# Patient Record
Sex: Male | Born: 1960 | Race: Black or African American | Hispanic: No | Marital: Married | Smoking: Current every day smoker
Health system: Southern US, Community
[De-identification: ages and names within clinical notes are randomized; demographics above are authoritative.]

## PROBLEM LIST (undated history)

## (undated) DIAGNOSIS — F329 Major depressive disorder, single episode, unspecified: Secondary | ICD-10-CM

## (undated) DIAGNOSIS — F250 Schizoaffective disorder, bipolar type: Secondary | ICD-10-CM

## (undated) DIAGNOSIS — Z21 Asymptomatic human immunodeficiency virus [HIV] infection status: Secondary | ICD-10-CM

## (undated) DIAGNOSIS — B20 Human immunodeficiency virus [HIV] disease: Secondary | ICD-10-CM

## (undated) DIAGNOSIS — F17210 Nicotine dependence, cigarettes, uncomplicated: Secondary | ICD-10-CM

## (undated) DIAGNOSIS — R21 Rash and other nonspecific skin eruption: Secondary | ICD-10-CM

## (undated) DIAGNOSIS — F32A Depression, unspecified: Secondary | ICD-10-CM

## (undated) DIAGNOSIS — F319 Bipolar disorder, unspecified: Secondary | ICD-10-CM

## (undated) HISTORY — DX: Schizoaffective disorder, bipolar type: F25.0

## (undated) HISTORY — DX: Depression, unspecified: F32.A

## (undated) HISTORY — DX: Rash and other nonspecific skin eruption: R21

## (undated) HISTORY — DX: Nicotine dependence, cigarettes, uncomplicated: F17.210

## (undated) HISTORY — DX: Asymptomatic human immunodeficiency virus (hiv) infection status: Z21

## (undated) HISTORY — DX: Bipolar disorder, unspecified: F31.9

## (undated) HISTORY — DX: Human immunodeficiency virus (HIV) disease: B20

---
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
## 1989-10-10 HISTORY — PX: OTHER SURGICAL HISTORY: SHX169

## 1999-06-25 ENCOUNTER — Encounter: Admission: RE | Admit: 1999-06-25 | Discharge: 1999-06-25 | Payer: Self-pay | Admitting: Internal Medicine

## 1999-06-25 ENCOUNTER — Ambulatory Visit (HOSPITAL_COMMUNITY): Admission: RE | Admit: 1999-06-25 | Discharge: 1999-06-25 | Payer: Self-pay | Admitting: Internal Medicine

## 1999-07-27 ENCOUNTER — Encounter: Admission: RE | Admit: 1999-07-27 | Discharge: 1999-07-27 | Payer: Self-pay | Admitting: Internal Medicine

## 1999-08-26 ENCOUNTER — Encounter: Admission: RE | Admit: 1999-08-26 | Discharge: 1999-08-26 | Payer: Self-pay | Admitting: Internal Medicine

## 1999-08-26 ENCOUNTER — Ambulatory Visit (HOSPITAL_COMMUNITY): Admission: RE | Admit: 1999-08-26 | Discharge: 1999-08-26 | Payer: Self-pay | Admitting: Internal Medicine

## 1999-09-13 ENCOUNTER — Encounter: Admission: RE | Admit: 1999-09-13 | Discharge: 1999-09-13 | Payer: Self-pay | Admitting: Internal Medicine

## 1999-12-28 ENCOUNTER — Ambulatory Visit (HOSPITAL_COMMUNITY): Admission: RE | Admit: 1999-12-28 | Discharge: 1999-12-28 | Payer: Self-pay | Admitting: Internal Medicine

## 1999-12-28 ENCOUNTER — Encounter: Admission: RE | Admit: 1999-12-28 | Discharge: 1999-12-28 | Payer: Self-pay | Admitting: Internal Medicine

## 2000-06-13 ENCOUNTER — Encounter: Admission: RE | Admit: 2000-06-13 | Discharge: 2000-06-13 | Payer: Self-pay | Admitting: Hematology and Oncology

## 2000-06-13 ENCOUNTER — Ambulatory Visit (HOSPITAL_COMMUNITY): Admission: RE | Admit: 2000-06-13 | Discharge: 2000-06-13 | Payer: Self-pay | Admitting: Internal Medicine

## 2000-06-27 ENCOUNTER — Encounter: Admission: RE | Admit: 2000-06-27 | Discharge: 2000-06-27 | Payer: Self-pay | Admitting: Internal Medicine

## 2000-08-29 ENCOUNTER — Encounter: Admission: RE | Admit: 2000-08-29 | Discharge: 2000-08-29 | Payer: Self-pay | Admitting: Internal Medicine

## 2000-10-31 ENCOUNTER — Encounter: Admission: RE | Admit: 2000-10-31 | Discharge: 2000-10-31 | Payer: Self-pay | Admitting: Internal Medicine

## 2001-01-03 ENCOUNTER — Encounter: Admission: RE | Admit: 2001-01-03 | Discharge: 2001-01-03 | Payer: Self-pay | Admitting: Internal Medicine

## 2001-01-03 ENCOUNTER — Ambulatory Visit (HOSPITAL_COMMUNITY): Admission: RE | Admit: 2001-01-03 | Discharge: 2001-01-03 | Payer: Self-pay | Admitting: Internal Medicine

## 2001-01-30 ENCOUNTER — Encounter: Admission: RE | Admit: 2001-01-30 | Discharge: 2001-01-30 | Payer: Self-pay | Admitting: Internal Medicine

## 2001-05-23 ENCOUNTER — Inpatient Hospital Stay (HOSPITAL_COMMUNITY): Admission: EM | Admit: 2001-05-23 | Discharge: 2001-05-28 | Payer: Self-pay | Admitting: Psychiatry

## 2001-08-28 ENCOUNTER — Encounter: Admission: RE | Admit: 2001-08-28 | Discharge: 2001-08-28 | Payer: Self-pay | Admitting: Internal Medicine

## 2001-08-28 ENCOUNTER — Ambulatory Visit (HOSPITAL_COMMUNITY): Admission: RE | Admit: 2001-08-28 | Discharge: 2001-08-28 | Payer: Self-pay | Admitting: Internal Medicine

## 2001-09-18 ENCOUNTER — Encounter: Admission: RE | Admit: 2001-09-18 | Discharge: 2001-09-18 | Payer: Self-pay | Admitting: Internal Medicine

## 2002-05-07 ENCOUNTER — Ambulatory Visit (HOSPITAL_COMMUNITY): Admission: RE | Admit: 2002-05-07 | Discharge: 2002-05-07 | Payer: Self-pay | Admitting: Internal Medicine

## 2002-05-07 ENCOUNTER — Encounter: Admission: RE | Admit: 2002-05-07 | Discharge: 2002-05-07 | Payer: Self-pay | Admitting: Internal Medicine

## 2002-06-11 ENCOUNTER — Encounter: Admission: RE | Admit: 2002-06-11 | Discharge: 2002-06-11 | Payer: Self-pay | Admitting: Internal Medicine

## 2002-08-22 ENCOUNTER — Encounter: Admission: RE | Admit: 2002-08-22 | Discharge: 2002-08-22 | Payer: Self-pay | Admitting: Internal Medicine

## 2002-08-22 ENCOUNTER — Ambulatory Visit (HOSPITAL_COMMUNITY): Admission: RE | Admit: 2002-08-22 | Discharge: 2002-08-22 | Payer: Self-pay | Admitting: Internal Medicine

## 2002-09-24 ENCOUNTER — Encounter: Admission: RE | Admit: 2002-09-24 | Discharge: 2002-09-24 | Payer: Self-pay | Admitting: Internal Medicine

## 2002-12-12 ENCOUNTER — Inpatient Hospital Stay (HOSPITAL_COMMUNITY): Admission: EM | Admit: 2002-12-12 | Discharge: 2002-12-15 | Payer: Self-pay | Admitting: Psychiatry

## 2003-01-27 ENCOUNTER — Encounter: Admission: RE | Admit: 2003-01-27 | Discharge: 2003-01-27 | Payer: Self-pay | Admitting: Internal Medicine

## 2003-02-04 ENCOUNTER — Encounter: Admission: RE | Admit: 2003-02-04 | Discharge: 2003-02-04 | Payer: Self-pay | Admitting: Internal Medicine

## 2003-04-01 ENCOUNTER — Encounter: Admission: RE | Admit: 2003-04-01 | Discharge: 2003-04-01 | Payer: Self-pay | Admitting: Internal Medicine

## 2003-08-26 ENCOUNTER — Encounter: Admission: RE | Admit: 2003-08-26 | Discharge: 2003-08-26 | Payer: Self-pay | Admitting: Internal Medicine

## 2003-08-26 ENCOUNTER — Ambulatory Visit (HOSPITAL_COMMUNITY): Admission: RE | Admit: 2003-08-26 | Discharge: 2003-08-26 | Payer: Self-pay | Admitting: Internal Medicine

## 2003-09-09 ENCOUNTER — Encounter: Admission: RE | Admit: 2003-09-09 | Discharge: 2003-09-09 | Payer: Self-pay | Admitting: Internal Medicine

## 2004-02-06 ENCOUNTER — Inpatient Hospital Stay (HOSPITAL_COMMUNITY): Admission: RE | Admit: 2004-02-06 | Discharge: 2004-02-12 | Payer: Self-pay | Admitting: *Deleted

## 2004-02-06 ENCOUNTER — Emergency Department (HOSPITAL_COMMUNITY): Admission: EM | Admit: 2004-02-06 | Discharge: 2004-02-06 | Payer: Self-pay

## 2004-02-23 ENCOUNTER — Encounter: Admission: RE | Admit: 2004-02-23 | Discharge: 2004-02-23 | Payer: Self-pay | Admitting: Internal Medicine

## 2004-03-09 ENCOUNTER — Encounter: Admission: RE | Admit: 2004-03-09 | Discharge: 2004-03-09 | Payer: Self-pay | Admitting: Internal Medicine

## 2004-05-09 ENCOUNTER — Emergency Department (HOSPITAL_COMMUNITY): Admission: EM | Admit: 2004-05-09 | Discharge: 2004-05-09 | Payer: Self-pay | Admitting: Emergency Medicine

## 2004-05-20 ENCOUNTER — Emergency Department (HOSPITAL_COMMUNITY): Admission: EM | Admit: 2004-05-20 | Discharge: 2004-05-21 | Payer: Self-pay | Admitting: Emergency Medicine

## 2004-05-24 ENCOUNTER — Encounter: Admission: RE | Admit: 2004-05-24 | Discharge: 2004-05-24 | Payer: Self-pay | Admitting: Internal Medicine

## 2004-05-24 ENCOUNTER — Ambulatory Visit (HOSPITAL_COMMUNITY): Admission: RE | Admit: 2004-05-24 | Discharge: 2004-05-24 | Payer: Self-pay | Admitting: Internal Medicine

## 2004-07-27 ENCOUNTER — Ambulatory Visit: Payer: Self-pay | Admitting: Internal Medicine

## 2004-09-15 ENCOUNTER — Ambulatory Visit: Payer: Self-pay | Admitting: Internal Medicine

## 2004-10-06 ENCOUNTER — Ambulatory Visit: Payer: Self-pay | Admitting: Internal Medicine

## 2004-10-08 ENCOUNTER — Ambulatory Visit: Payer: Self-pay | Admitting: Internal Medicine

## 2004-11-08 ENCOUNTER — Ambulatory Visit: Payer: Self-pay | Admitting: Internal Medicine

## 2004-11-11 ENCOUNTER — Ambulatory Visit: Payer: Self-pay | Admitting: Internal Medicine

## 2004-11-17 ENCOUNTER — Encounter: Admission: RE | Admit: 2004-11-17 | Discharge: 2005-02-15 | Payer: Self-pay | Admitting: Internal Medicine

## 2004-12-09 ENCOUNTER — Ambulatory Visit: Payer: Self-pay | Admitting: Internal Medicine

## 2004-12-09 ENCOUNTER — Ambulatory Visit (HOSPITAL_COMMUNITY): Admission: RE | Admit: 2004-12-09 | Discharge: 2004-12-09 | Payer: Self-pay | Admitting: Internal Medicine

## 2004-12-21 ENCOUNTER — Ambulatory Visit: Payer: Self-pay | Admitting: Internal Medicine

## 2005-02-04 ENCOUNTER — Ambulatory Visit: Payer: Self-pay | Admitting: Internal Medicine

## 2005-02-23 ENCOUNTER — Ambulatory Visit: Payer: Self-pay | Admitting: Endocrinology

## 2005-03-14 ENCOUNTER — Inpatient Hospital Stay (HOSPITAL_COMMUNITY): Admission: RE | Admit: 2005-03-14 | Discharge: 2005-03-17 | Payer: Self-pay | Admitting: Psychiatry

## 2005-03-14 ENCOUNTER — Ambulatory Visit: Payer: Self-pay | Admitting: Psychiatry

## 2005-03-19 ENCOUNTER — Emergency Department (HOSPITAL_COMMUNITY): Admission: EM | Admit: 2005-03-19 | Discharge: 2005-03-19 | Payer: Self-pay | Admitting: Emergency Medicine

## 2005-04-14 ENCOUNTER — Ambulatory Visit: Payer: Self-pay | Admitting: Psychiatry

## 2005-04-14 ENCOUNTER — Inpatient Hospital Stay (HOSPITAL_COMMUNITY): Admission: RE | Admit: 2005-04-14 | Discharge: 2005-04-15 | Payer: Self-pay | Admitting: Psychiatry

## 2005-07-15 ENCOUNTER — Emergency Department (HOSPITAL_COMMUNITY): Admission: EM | Admit: 2005-07-15 | Discharge: 2005-07-15 | Payer: Self-pay

## 2005-08-02 ENCOUNTER — Ambulatory Visit: Payer: Self-pay | Admitting: Psychiatry

## 2005-08-12 ENCOUNTER — Ambulatory Visit (HOSPITAL_COMMUNITY): Admission: RE | Admit: 2005-08-12 | Discharge: 2005-08-12 | Payer: Self-pay | Admitting: Family Medicine

## 2005-08-30 ENCOUNTER — Ambulatory Visit: Payer: Self-pay | Admitting: Psychiatry

## 2005-09-10 ENCOUNTER — Emergency Department (HOSPITAL_COMMUNITY): Admission: EM | Admit: 2005-09-10 | Discharge: 2005-09-11 | Payer: Self-pay | Admitting: Emergency Medicine

## 2011-05-12 ENCOUNTER — Ambulatory Visit (INDEPENDENT_AMBULATORY_CARE_PROVIDER_SITE_OTHER): Payer: Federal, State, Local not specified - PPO

## 2011-05-12 DIAGNOSIS — Z113 Encounter for screening for infections with a predominantly sexual mode of transmission: Secondary | ICD-10-CM

## 2011-05-12 DIAGNOSIS — F319 Bipolar disorder, unspecified: Secondary | ICD-10-CM

## 2011-05-12 DIAGNOSIS — B20 Human immunodeficiency virus [HIV] disease: Secondary | ICD-10-CM

## 2011-05-12 DIAGNOSIS — Z119 Encounter for screening for infectious and parasitic diseases, unspecified: Secondary | ICD-10-CM

## 2011-05-12 DIAGNOSIS — F259 Schizoaffective disorder, unspecified: Secondary | ICD-10-CM

## 2011-05-12 DIAGNOSIS — Z1159 Encounter for screening for other viral diseases: Secondary | ICD-10-CM

## 2011-05-12 DIAGNOSIS — Z111 Encounter for screening for respiratory tuberculosis: Secondary | ICD-10-CM

## 2011-05-12 DIAGNOSIS — R21 Rash and other nonspecific skin eruption: Secondary | ICD-10-CM

## 2011-05-12 DIAGNOSIS — Z13220 Encounter for screening for lipoid disorders: Secondary | ICD-10-CM

## 2011-05-12 LAB — URINALYSIS
Bilirubin Urine: NEGATIVE
Hgb urine dipstick: NEGATIVE
Ketones, ur: NEGATIVE mg/dL
Protein, ur: NEGATIVE mg/dL
Specific Gravity, Urine: 1.017 (ref 1.005–1.030)
Urobilinogen, UA: 0.2 mg/dL (ref 0.0–1.0)
pH: 5 (ref 5.0–8.0)

## 2011-05-12 LAB — LIPID PANEL
Cholesterol: 181 mg/dL (ref 0–200)
HDL: 39 mg/dL — ABNORMAL LOW (ref >39)
Total CHOL/HDL Ratio: 4.6 Ratio
Triglycerides: 61 mg/dL (ref <150)
VLDL: 12 mg/dL (ref 0–40)

## 2011-05-12 LAB — RPR

## 2011-05-12 LAB — HEPATITIS C ANTIBODY: HCV Ab: NEGATIVE

## 2011-05-12 LAB — HEPATITIS B SURFACE ANTIGEN: Hepatitis B Surface Ag: NEGATIVE

## 2011-05-12 LAB — HEPATITIS B SURFACE ANTIBODY,QUALITATIVE: Hep B S Ab: NEGATIVE

## 2011-05-13 DIAGNOSIS — Z21 Asymptomatic human immunodeficiency virus [HIV] infection status: Secondary | ICD-10-CM

## 2011-05-13 DIAGNOSIS — B20 Human immunodeficiency virus [HIV] disease: Secondary | ICD-10-CM

## 2011-05-13 DIAGNOSIS — F319 Bipolar disorder, unspecified: Secondary | ICD-10-CM

## 2011-05-13 DIAGNOSIS — F259 Schizoaffective disorder, unspecified: Secondary | ICD-10-CM

## 2011-05-13 DIAGNOSIS — R21 Rash and other nonspecific skin eruption: Secondary | ICD-10-CM

## 2011-05-13 HISTORY — DX: Asymptomatic human immunodeficiency virus (hiv) infection status: Z21

## 2011-05-13 HISTORY — DX: Human immunodeficiency virus (HIV) disease: B20

## 2011-05-13 HISTORY — DX: Bipolar disorder, unspecified: F31.9

## 2011-05-13 HISTORY — DX: Rash and other nonspecific skin eruption: R21

## 2011-05-13 HISTORY — DX: Schizoaffective disorder, unspecified: F25.9

## 2011-05-13 LAB — COMPLETE METABOLIC PANEL WITH GFR
ALT: 13 U/L (ref 0–53)
AST: 21 U/L (ref 0–37)
CO2: 26 mEq/L (ref 19–32)
Chloride: 102 mEq/L (ref 96–112)
Creat: 0.97 mg/dL (ref 0.50–1.35)
GFR, Est African American: >60 mL/min (ref >60)
GFR, Est Non African American: >60 mL/min (ref >60)
Potassium: 4.3 mEq/L (ref 3.5–5.3)
Sodium: 135 mEq/L (ref 135–145)
Total Bilirubin: 0.6 mg/dL (ref 0.3–1.2)
Total Protein: 7.9 g/dL (ref 6.0–8.3)

## 2011-05-13 LAB — CBC WITH DIFFERENTIAL/PLATELET
Basophils Absolute: 0 10*3/uL (ref 0.0–0.1)
Basophils Relative: 1 % (ref 0–1)
Eosinophils Absolute: 0.2 10*3/uL (ref 0.0–0.7)
Eosinophils Relative: 3 % (ref 0–5)
HCT: 44.9 % (ref 39.0–52.0)
Hemoglobin: 15.7 g/dL (ref 13.0–17.0)
Lymphocytes Relative: 45 % (ref 12–46)
MCH: 30.7 pg (ref 26.0–34.0)
MCHC: 35 g/dL (ref 30.0–36.0)
MCV: 87.9 fL (ref 78.0–100.0)
Monocytes Absolute: 0.4 10*3/uL (ref 0.1–1.0)
Monocytes Relative: 5 % (ref 3–12)
Neutro Abs: 3.4 10*3/uL (ref 1.7–7.7)
Neutrophils Relative %: 47 % (ref 43–77)
Platelets: 211 10*3/uL (ref 150–400)
RBC: 5.11 MIL/uL (ref 4.22–5.81)
RDW: 15.7 % — ABNORMAL HIGH (ref 11.5–15.5)
WBC: 7.2 10*3/uL (ref 4.0–10.5)

## 2011-05-13 LAB — HEPATITIS A ANTIBODY, IGM: Hep A IgM: NEGATIVE

## 2011-05-13 LAB — T-HELPER CELL (CD4) - (RCID CLINIC ONLY)
CD4 % Helper T Cell: 7 % — ABNORMAL LOW (ref 33–55)
CD4 T Cell Abs: 230 uL — ABNORMAL LOW (ref 400–2700)

## 2011-05-13 LAB — GC/CHLAMYDIA PROBE AMP, URINE
Chlamydia, Swab/Urine, PCR: NEGATIVE
GC Probe Amp, Urine: NEGATIVE

## 2011-05-13 LAB — HEPATITIS B CORE ANTIBODY, IGM: Hep B C IgM: NEGATIVE

## 2011-05-13 NOTE — Progress Notes (Signed)
Pt states he has been traveling around the [place] "just enjoying life" and has been taking his HAART" on and off". He has grown tired of moving from place to place and is" back in [place] to settle down". His last treatment was at the [facility] in [place] but there were no current HIV labs in records. He was very pleasant and pleased to be seeing Dr Orvan Falconer again who was his previous physician in 2006.

## 2011-05-16 LAB — HIV-1 RNA ULTRAQUANT REFLEX TO GENTYP+
HIV 1 RNA Quant: 48 copies/mL — ABNORMAL HIGH (ref <20)
HIV-1 RNA Quant, Log: 1.68 {Log} — ABNORMAL HIGH (ref <1.30)

## 2011-05-26 ENCOUNTER — Ambulatory Visit (INDEPENDENT_AMBULATORY_CARE_PROVIDER_SITE_OTHER): Payer: Federal, State, Local not specified - PPO | Admitting: Internal Medicine

## 2011-05-26 ENCOUNTER — Other Ambulatory Visit: Payer: Self-pay | Admitting: *Deleted

## 2011-05-26 ENCOUNTER — Encounter: Payer: Self-pay | Admitting: Internal Medicine

## 2011-05-26 VITALS — BP 127/88 | HR 72 | Temp 98.2°F | Ht 72.0 in | Wt 224.4 lb

## 2011-05-26 DIAGNOSIS — F17200 Nicotine dependence, unspecified, uncomplicated: Secondary | ICD-10-CM

## 2011-05-26 DIAGNOSIS — F17210 Nicotine dependence, cigarettes, uncomplicated: Secondary | ICD-10-CM | POA: Insufficient documentation

## 2011-05-26 DIAGNOSIS — B20 Human immunodeficiency virus [HIV] disease: Secondary | ICD-10-CM

## 2011-05-26 DIAGNOSIS — F259 Schizoaffective disorder, unspecified: Secondary | ICD-10-CM

## 2011-05-26 DIAGNOSIS — Z23 Encounter for immunization: Secondary | ICD-10-CM

## 2011-05-26 DIAGNOSIS — R21 Rash and other nonspecific skin eruption: Secondary | ICD-10-CM

## 2011-05-26 HISTORY — DX: Nicotine dependence, cigarettes, uncomplicated: F17.210

## 2011-05-26 MED ORDER — TRIAMCINOLONE ACETONIDE 0.1 % EX CREA: TOPICAL_CREAM | Freq: Two times a day (BID) | CUTANEOUS | Status: DC

## 2011-05-26 MED ORDER — RALTEGRAVIR POTASSIUM 400 MG PO TABS: 400.0000 mg | ORAL_TABLET | Freq: Two times a day (BID) | ORAL | Status: DC

## 2011-05-26 MED ORDER — SULFAMETHOXAZOLE-TMP DS 800-160 MG PO TABS: 1.0000 | ORAL_TABLET | Freq: Every day | ORAL | Status: DC

## 2011-05-26 MED ORDER — OLANZAPINE 20 MG PO TABS: 20.0000 mg | ORAL_TABLET | Freq: Every day | ORAL | Status: DC

## 2011-05-26 MED ORDER — EMTRICITABINE-TENOFOVIR DF 200-300 MG PO TABS: 1.0000 | ORAL_TABLET | Freq: Every day | ORAL | Status: DC

## 2011-05-26 NOTE — Progress Notes (Signed)
Addended by: Jennet Maduro D on: 05/26/2011 02:38 PM   Modules accepted: Orders

## 2011-05-26 NOTE — Assessment & Plan Note (Signed)
I will continue topical steroids as needed.

## 2011-05-26 NOTE — Telephone Encounter (Signed)
RN requested that the pt obtain Mental Health services for future refills of his mental health medications.  Pt to call [facility] for intake to obtain case management services.  Pt verbalized understanding.

## 2011-05-26 NOTE — Assessment & Plan Note (Signed)
I will continue Zyprexa.

## 2011-05-26 NOTE — Assessment & Plan Note (Signed)
He seems to be doing reasonably well on his current regimen with good adherence. His CD4 count is 230 and his viral load is 48. I will continue his current regimen. He has refused hepatitis B vaccination today stating that since she is not sexually active he should not be at risk for hepatitis B.

## 2011-05-26 NOTE — Progress Notes (Signed)
  Subjective:    Patient ID: Randall Garrett, male    DOB: 04/07/1961, 50 y.o.   MRN: [id number]  HPI [name] is in for his first visit here since 2006. He tells me that he moved to [place] in 2006 and quit taking his medications for a period of time. He says that his CD4 count down to 19 and it was very scary period for him. He developed a generalized pruritic rash. Biopsy showed an eosinophilic dermatitis that has been treated with topical steroids with some success. In 2009 he was restarted on a new regimen of Truvada and Isentress. He is on disability because of his bipolar disorder and HIV infection and has [employer]. He has been able to afford his co-pays and has tolerated the medication well. He states that he is only missed about 2 doses in the past month. He does not use her pillbox. He states that taking his medications correctly is simply programmed into his brain. He is currently living alone in an apartment hearing [place] [address]. He has not been sexually active in the past year and has no plans to become sexually active. He says that he is in somewhat of an emotional relationship but will not elaborate. He says that he is doing well and the Zyprexa has kept his bipolar disorder and check but does admit that he is under some emotional stress right now that he does not want to go into. He has thought about quitting cigarettes but does not feel that this is the time for him to try. He is not drinking any alcohol or using any illicit drugs.    Review of Systems     Objective:   Physical Exam  Constitutional: He appears well-developed and well-nourished. No distress.  HENT:  Mouth/Throat: Oropharynx is clear and moist. No oropharyngeal exudate.       He has about an 8 mm nontender nodule at the gumline on the lower left side. He states it's been there for about one year and is unchanged.  Eyes: Conjunctivae are normal.  Cardiovascular: Normal rate, regular  rhythm and normal heart sounds.   No murmur heard. Pulmonary/Chest: Effort normal and breath sounds normal. He has no rales.  Abdominal: Soft. Bowel sounds are normal. He exhibits no distension and no mass. There is no tenderness.  Skin:       He has some hyperpigmented, excoriated papules on his forearms and over the bridge of his nose  Psychiatric: He has a normal mood and affect.          Assessment & Plan:

## 2011-06-01 ENCOUNTER — Telehealth: Payer: Self-pay | Admitting: *Deleted

## 2011-06-01 NOTE — Telephone Encounter (Signed)
Pharmacy called advised patient usually takes his Zyprexa 2 tabs at bedtime and we sent 1 tab at bedtime. Looked in the patients chart note from his last visit and the provider wrote that he was to continue the Zyprexa without mention of any changes, because patient reports it is working to keep his bipolar under control. Based on this I gave the verbal to give 2 tabs at bedtime #60.

## 2011-07-15 ENCOUNTER — Other Ambulatory Visit: Payer: Self-pay | Admitting: Licensed Clinical Social Worker

## 2011-07-15 DIAGNOSIS — F319 Bipolar disorder, unspecified: Secondary | ICD-10-CM

## 2011-07-15 DIAGNOSIS — R21 Rash and other nonspecific skin eruption: Secondary | ICD-10-CM

## 2011-07-15 DIAGNOSIS — B20 Human immunodeficiency virus [HIV] disease: Secondary | ICD-10-CM

## 2011-07-15 MED ORDER — OLANZAPINE 20 MG PO TABS: 40.0000 mg | ORAL_TABLET | Freq: Every day | ORAL | Status: DC

## 2011-07-15 MED ORDER — SULFAMETHOXAZOLE-TMP DS 800-160 MG PO TABS: 1.0000 | ORAL_TABLET | Freq: Every day | ORAL | Status: DC

## 2011-07-15 MED ORDER — RALTEGRAVIR POTASSIUM 400 MG PO TABS: 400.0000 mg | ORAL_TABLET | Freq: Two times a day (BID) | ORAL | Status: DC

## 2011-07-15 MED ORDER — EMTRICITABINE-TENOFOVIR DF 200-300 MG PO TABS: 1.0000 | ORAL_TABLET | Freq: Every day | ORAL | Status: DC

## 2011-08-06 ENCOUNTER — Other Ambulatory Visit: Payer: Self-pay | Admitting: Internal Medicine

## 2011-08-06 DIAGNOSIS — F259 Schizoaffective disorder, unspecified: Secondary | ICD-10-CM

## 2011-08-15 ENCOUNTER — Other Ambulatory Visit: Payer: Federal, State, Local not specified - PPO

## 2011-08-15 ENCOUNTER — Other Ambulatory Visit (INDEPENDENT_AMBULATORY_CARE_PROVIDER_SITE_OTHER): Payer: Federal, State, Local not specified - PPO

## 2011-08-15 ENCOUNTER — Other Ambulatory Visit: Payer: Self-pay | Admitting: Internal Medicine

## 2011-08-15 DIAGNOSIS — B20 Human immunodeficiency virus [HIV] disease: Secondary | ICD-10-CM

## 2011-08-16 LAB — T-HELPER CELL (CD4) - (RCID CLINIC ONLY): CD4 % Helper T Cell: 8 % — ABNORMAL LOW (ref 33–55)

## 2011-08-17 LAB — HIV-1 RNA QUANT-NO REFLEX-BLD
HIV 1 RNA Quant: <20 copies/mL (ref <20)
HIV-1 RNA Quant, Log: <1.3 {Log} (ref <1.30)

## 2011-08-29 ENCOUNTER — Ambulatory Visit (INDEPENDENT_AMBULATORY_CARE_PROVIDER_SITE_OTHER): Payer: Federal, State, Local not specified - PPO | Admitting: Internal Medicine

## 2011-08-29 ENCOUNTER — Encounter: Payer: Self-pay | Admitting: Internal Medicine

## 2011-08-29 ENCOUNTER — Other Ambulatory Visit: Payer: Self-pay | Admitting: *Deleted

## 2011-08-29 DIAGNOSIS — B20 Human immunodeficiency virus [HIV] disease: Secondary | ICD-10-CM

## 2011-08-29 DIAGNOSIS — Z21 Asymptomatic human immunodeficiency virus [HIV] infection status: Secondary | ICD-10-CM

## 2011-08-29 DIAGNOSIS — F319 Bipolar disorder, unspecified: Secondary | ICD-10-CM

## 2011-08-29 MED ORDER — EMTRICITABINE-TENOFOVIR DF 200-300 MG PO TABS: 1.0000 | ORAL_TABLET | Freq: Every day | ORAL | Status: DC

## 2011-08-29 MED ORDER — OLANZAPINE 20 MG PO TABS: 40.0000 mg | ORAL_TABLET | Freq: Every day | ORAL | Status: DC

## 2011-08-29 MED ORDER — RALTEGRAVIR POTASSIUM 400 MG PO TABS: 400.0000 mg | ORAL_TABLET | Freq: Two times a day (BID) | ORAL | Status: DC

## 2011-08-29 NOTE — Progress Notes (Signed)
  Subjective:    Patient ID: Randall Garrett, male    DOB: [birth year], 50 y.o.   MRN: [id number]  HPI Randall Garrett is in for his routine visit. He denies missing any of his medications. He is doing well without any new problems or concerns.    Review of Systems     Objective:   Physical Exam  Constitutional: He appears well-developed and well-nourished. No distress.  HENT:  Mouth/Throat: Oropharynx is clear and moist. No oropharyngeal exudate.       He has about an 8 mm nontender nodule at the gumline on the lower left side. He states it's been there for about one year and is unchanged.  Eyes: Conjunctivae are normal.  Cardiovascular: Normal rate, regular rhythm and normal heart sounds.   No murmur heard. Pulmonary/Chest: Effort normal and breath sounds normal. He has no rales.  Abdominal: Soft. Bowel sounds are normal. He exhibits no distension and no mass. There is no tenderness.  Skin:       He has some hyperpigmented, excoriated papules on his forearms and over the bridge of his nose  Psychiatric: He has a normal mood and affect.          Assessment & Plan:

## 2012-02-13 ENCOUNTER — Other Ambulatory Visit: Payer: Federal, State, Local not specified - PPO

## 2012-02-14 ENCOUNTER — Other Ambulatory Visit: Payer: Federal, State, Local not specified - PPO

## 2012-02-21 ENCOUNTER — Other Ambulatory Visit: Payer: Federal, State, Local not specified - PPO

## 2012-02-21 DIAGNOSIS — B20 Human immunodeficiency virus [HIV] disease: Secondary | ICD-10-CM

## 2012-02-21 NOTE — Progress Notes (Signed)
Addended by: Mariea Clonts D on: 02/21/2012 04:45 PM   Modules accepted: Orders

## 2012-02-22 LAB — COMPREHENSIVE METABOLIC PANEL
AST: 17 U/L (ref 0–37)
Albumin: 3.9 g/dL (ref 3.5–5.2)
Alkaline Phosphatase: 69 U/L (ref 39–117)
Calcium: 8.4 mg/dL (ref 8.4–10.5)
Chloride: 106 mEq/L (ref 96–112)
Glucose, Bld: 85 mg/dL (ref 70–99)
Potassium: 4.4 mEq/L (ref 3.5–5.3)
Sodium: 136 mEq/L (ref 135–145)
Total Protein: 6.8 g/dL (ref 6.0–8.3)

## 2012-02-22 LAB — CBC
MCH: 31 pg (ref 26.0–34.0)
MCHC: 36.3 g/dL — ABNORMAL HIGH (ref 30.0–36.0)
MCV: 85.5 fL (ref 78.0–100.0)
Platelets: 197 10*3/uL (ref 150–400)
RBC: 4.61 MIL/uL (ref 4.22–5.81)
RDW: 15 % (ref 11.5–15.5)
WBC: 6.8 10*3/uL (ref 4.0–10.5)

## 2012-02-22 LAB — RPR

## 2012-02-22 LAB — LIPID PANEL: LDL Cholesterol: 107 mg/dL — ABNORMAL HIGH (ref 0–99)

## 2012-02-23 LAB — HIV-1 RNA QUANT-NO REFLEX-BLD: HIV-1 RNA Quant, Log: <1.3 {Log} (ref <1.30)

## 2012-02-28 ENCOUNTER — Ambulatory Visit: Payer: Federal, State, Local not specified - PPO | Admitting: Internal Medicine

## 2012-03-06 ENCOUNTER — Other Ambulatory Visit: Payer: Self-pay | Admitting: *Deleted

## 2012-03-06 DIAGNOSIS — F319 Bipolar disorder, unspecified: Secondary | ICD-10-CM

## 2012-03-06 MED ORDER — OLANZAPINE 20 MG PO TABS: 40.0000 mg | ORAL_TABLET | Freq: Every day | ORAL | Status: DC

## 2012-03-13 ENCOUNTER — Ambulatory Visit (INDEPENDENT_AMBULATORY_CARE_PROVIDER_SITE_OTHER): Payer: Federal, State, Local not specified - PPO | Admitting: Internal Medicine

## 2012-03-13 ENCOUNTER — Encounter: Payer: Self-pay | Admitting: Internal Medicine

## 2012-03-13 VITALS — BP 115/78 | HR 76 | Temp 98.6°F | Ht 72.0 in | Wt 231.5 lb

## 2012-03-13 DIAGNOSIS — Z21 Asymptomatic human immunodeficiency virus [HIV] infection status: Secondary | ICD-10-CM

## 2012-03-13 DIAGNOSIS — B20 Human immunodeficiency virus [HIV] disease: Secondary | ICD-10-CM

## 2012-03-13 MED ORDER — TRIAMCINOLONE ACETONIDE 0.1 % EX CREA: 1.0000 "application " | TOPICAL_CREAM | Freq: Two times a day (BID) | CUTANEOUS | Status: DC

## 2012-03-13 NOTE — Progress Notes (Signed)
Patient ID: Randall Garrett, male   DOB: 1961-01-06, 51 y.o.   MRN: [id number]     [facility] for Infectious Disease  Patient Active Problem List  Diagnoses  . Bipolar 1 disorder  . HIV (human immunodeficiency virus infection)  . Schizo affective schizophrenia  . Rash, skin  . Cigarette smoker    Patient's Medications  New Prescriptions   No medications on file  Previous Medications   EMTRICITABINE-TENOFOVIR (TRUVADA) 200-300 MG PER TABLET    Take 1 tablet by mouth daily.   OLANZAPINE (ZYPREXA) 20 MG TABLET    Take 2 tablets (40 mg total) by mouth at bedtime.   RALTEGRAVIR (ISENTRESS) 400 MG TABLET    Take 1 tablet (400 mg total) by mouth 2 (two) times daily.  Modified Medications   Modified Medication Previous Medication   TRIAMCINOLONE CREAM (KENALOG) 0.1 % triamcinolone cream (KENALOG) 0.1 %      Apply 1 application topically 2 (two) times daily.    Apply 1 application topically 2 (two) times daily.  Discontinued Medications   No medications on file    Subjective: Randall Garrett is in for his routine visit. He recalls missing only one dose of his Truvada and Isentress since his last visit when he simply forgot. He continues to take his Zyprexa. He is doing well except a recurrence of his mildly pruritic rash on his forearms and face. This tends to occur every summer. He has treated successfully in the past with triamcinolone cream but is currently out. He does want to quit smoking cigarettes but has no plan to quit.  Objective: Temp: 98.6 F (37 C) (06/04 1408) Temp src: Oral (06/04 1408) BP: 115/78 mmHg (06/04 1408) Pulse Rate: 76  (06/04 1408)  General: He is in good spirits Skin: He has slightly excoriated papular rash on his forearms and a few spots on his scalp and over the bridge of his nose Lungs: Clear Cor: Regular S1 and S2 no murmurs Abdomen: Nontender  Lab Results HIV 1 RNA Quant (copies/mL)  Date Value  02/21/2012 <20   08/15/2011 <20   05/12/2011 48*     CD4  T Cell Abs (cmm)  Date Value  08/15/2011 210*  05/12/2011 230*     Assessment: Randall Garrett far load remains undetectable. I will continue his current regimen and encouraged him to try not to miss a single dose of his medications.  He has been given written information about resources to help him come up with a cigarette cessation plan.  He has a nonspecific rash on his forearms. I will give him a refill of triamcinolone cream.  Plan: 1. Continue current antibiotics 2. Triamcinolone cream 3. Cigarette cessation counseling 4. Followup after lab work in 6 months   Cliffton Asters, MD [facility] for Infectious Disease [facility] [phone] pager   [phone] cell 03/13/2012, 2:44 PM

## 2012-03-31 ENCOUNTER — Other Ambulatory Visit: Payer: Self-pay | Admitting: Internal Medicine

## 2012-05-09 ENCOUNTER — Other Ambulatory Visit: Payer: Self-pay | Admitting: Internal Medicine

## 2012-06-05 ENCOUNTER — Other Ambulatory Visit: Payer: Self-pay | Admitting: Internal Medicine

## 2012-08-08 ENCOUNTER — Ambulatory Visit (INDEPENDENT_AMBULATORY_CARE_PROVIDER_SITE_OTHER): Payer: Federal, State, Local not specified - PPO | Admitting: Internal Medicine

## 2012-08-08 ENCOUNTER — Encounter: Payer: Self-pay | Admitting: Internal Medicine

## 2012-08-08 VITALS — BP 128/85 | HR 82 | Temp 97.8°F | Ht 72.0 in | Wt 227.5 lb

## 2012-08-08 DIAGNOSIS — B20 Human immunodeficiency virus [HIV] disease: Secondary | ICD-10-CM

## 2012-08-08 DIAGNOSIS — R21 Rash and other nonspecific skin eruption: Secondary | ICD-10-CM

## 2012-08-08 DIAGNOSIS — Z23 Encounter for immunization: Secondary | ICD-10-CM

## 2012-08-08 DIAGNOSIS — Z21 Asymptomatic human immunodeficiency virus [HIV] infection status: Secondary | ICD-10-CM

## 2012-08-08 LAB — CBC
MCH: 31.5 pg (ref 26.0–34.0)
MCHC: 36.4 g/dL — ABNORMAL HIGH (ref 30.0–36.0)
Platelets: 264 10*3/uL (ref 150–400)
RBC: 5.34 MIL/uL (ref 4.22–5.81)

## 2012-08-08 NOTE — Addendum Note (Signed)
Addended by: Jennet Maduro D on: 08/08/2012 03:14 PM   Modules accepted: Orders

## 2012-08-08 NOTE — Patient Instructions (Signed)
Pt given handout for Randall Garrett PCP office.

## 2012-08-08 NOTE — Progress Notes (Signed)
Patient ID: Randall Garrett, male   DOB: 01/28/1961, 51 y.o.   MRN: [id number]     [facility] for Infectious Disease  Patient Active Problem List  Diagnosis  . Bipolar 1 disorder  . HIV (human immunodeficiency virus infection)  . Schizo affective schizophrenia  . Rash, skin  . Cigarette smoker    Patient's Medications  New Prescriptions   No medications on file  Previous Medications   EMTRICITABINE-TENOFOVIR (TRUVADA) 200-300 MG PER TABLET    Take 1 tablet by mouth daily.   ISENTRESS 400 MG TABLET    TAKE 1 TABLET BY MOUTH 2 TIMES DAILY.   TRIAMCINOLONE CREAM (KENALOG) 0.1 %    Apply 1 application topically 2 (two) times daily.   ZYPREXA 20 MG TABLET    TAKE 2 TABLETS AT BEDTIME  Modified Medications   No medications on file  Discontinued Medications   RALTEGRAVIR (ISENTRESS) 400 MG TABLET    Take 1 tablet (400 mg total) by mouth 2 (two) times daily.   TRUVADA 200-300 MG PER TABLET    TAKE 1 TABLET BY MOUTH DAILY.   ZYPREXA 20 MG TABLET    TAKE 2 TABLETS AT BEDTIME    Subjective: [name] is seen on a work in basis. He has developed a recurrence of his mildly pruritic rash. It flared up again about 2 weeks ago and he started back using his triamcinolone cream twice daily. He has had only modest improvement and decided to come in for a visit. This is the same rash that he had several years ago when living in [place]. He was seen on several occasions by a dermatologist at [facility] in no longer practices there. He cannot recall her name. He states that they did a biopsy of some lesions on his face but could never determine the cause. I did treat him with triamcinolone cream.  He states that his only missed one dose of his Truvada and Isentress in the past 2 months. There has been a typical pattern over the course of the past few years. He generally misses the evening dose when he simply forgets.  He states that he does not want the influenza vaccine because he never gets colds or  flu.  Objective: Temp: 97.8 F (36.6 C) (10/30 1400) BP: 128/85 mmHg (10/30 1400) Pulse Rate: 82  (10/30 1400)  General:  is in no distress  Eyes: No conjunctival lesions Oral: No oral pharyngeal lesions Skin: He has a diffuse papular rash most notable on his left elbow with varying size papules. He has hyperpigmented scars from previous lesions. He has dry skin over his back. He has healed lacerations and multiple locations. Lungs:  clear  Cor:  regular S1-S2 no murmurs   Lab Results HIV 1 RNA Quant (copies/mL)  Date Value  02/21/2012 <20   08/15/2011 <20   05/12/2011 48*     CD4 T Cell Abs (cmm)  Date Value  08/15/2011 210*  05/12/2011 230*     Assessment: His adherence with his antiretroviral regimen is good. I will continue Truvada and Isentress and repeat lab work today and see him back in December.   I do not know the cause of his rash and it has not had a good response to reinstitution of triamcinolone cream. I will see if we can arrange local dermatology evaluation.  I have encouraged him again to consider cigarette cessation.  I was able to convince him to have influenza vaccination today.   Plan: 1.  continue  current antiretroviral regimen  2.  check lab work today 3. influenza vaccination 4. Dermatology referral 5. Encouraged to establish primary care   Randall Asters, MD [facility] for Infectious Disease [facility] [phone] pager   [phone] cell 08/08/2012, 2:52 PM

## 2012-08-09 LAB — RPR

## 2012-08-09 LAB — COMPREHENSIVE METABOLIC PANEL
Albumin: 4.4 g/dL (ref 3.5–5.2)
Alkaline Phosphatase: 69 U/L (ref 39–117)
CO2: 26 mEq/L (ref 19–32)
Calcium: 9.4 mg/dL (ref 8.4–10.5)
Sodium: 137 mEq/L (ref 135–145)
Total Bilirubin: 0.4 mg/dL (ref 0.3–1.2)

## 2012-08-09 LAB — LIPID PANEL
Cholesterol: 192 mg/dL (ref 0–200)
HDL: 38 mg/dL — ABNORMAL LOW (ref >39)
Total CHOL/HDL Ratio: 5.1 Ratio
Triglycerides: 120 mg/dL (ref <150)

## 2012-08-09 LAB — HIV-1 RNA QUANT-NO REFLEX-BLD: HIV 1 RNA Quant: <20 copies/mL (ref <20)

## 2012-09-11 ENCOUNTER — Other Ambulatory Visit: Payer: Federal, State, Local not specified - PPO

## 2012-09-25 ENCOUNTER — Telehealth: Payer: Self-pay | Admitting: *Deleted

## 2012-09-25 ENCOUNTER — Ambulatory Visit: Payer: Federal, State, Local not specified - PPO | Admitting: Internal Medicine

## 2012-09-25 NOTE — Telephone Encounter (Signed)
Called patient and rescheduled his appt for 10/17/11,  He now showed today. Randall Garrett

## 2012-10-16 ENCOUNTER — Ambulatory Visit: Payer: Federal, State, Local not specified - PPO | Admitting: Internal Medicine

## 2012-10-25 ENCOUNTER — Ambulatory Visit: Payer: Federal, State, Local not specified - PPO | Admitting: Internal Medicine

## 2012-10-25 ENCOUNTER — Encounter: Payer: Self-pay | Admitting: Internal Medicine

## 2012-10-25 VITALS — BP 135/90 | HR 73 | Temp 97.4°F | Ht 73.0 in | Wt 241.8 lb

## 2012-10-29 ENCOUNTER — Ambulatory Visit: Payer: Federal, State, Local not specified - PPO

## 2012-11-06 ENCOUNTER — Ambulatory Visit: Payer: Federal, State, Local not specified - PPO | Admitting: Internal Medicine

## 2012-11-06 ENCOUNTER — Telehealth: Payer: Self-pay | Admitting: *Deleted

## 2012-11-06 NOTE — Telephone Encounter (Signed)
Message left requesting pt call for appt w/ Dr. Orvan Falconer.

## 2012-11-15 ENCOUNTER — Other Ambulatory Visit (INDEPENDENT_AMBULATORY_CARE_PROVIDER_SITE_OTHER): Payer: Federal, State, Local not specified - PPO

## 2012-11-15 ENCOUNTER — Encounter: Payer: Self-pay | Admitting: Internal Medicine

## 2012-11-15 ENCOUNTER — Ambulatory Visit (INDEPENDENT_AMBULATORY_CARE_PROVIDER_SITE_OTHER): Payer: Federal, State, Local not specified - PPO | Admitting: Internal Medicine

## 2012-11-15 VITALS — BP 128/82 | HR 57 | Temp 97.6°F | Ht 72.0 in | Wt 244.2 lb

## 2012-11-15 DIAGNOSIS — F209 Schizophrenia, unspecified: Secondary | ICD-10-CM

## 2012-11-15 DIAGNOSIS — Z125 Encounter for screening for malignant neoplasm of prostate: Secondary | ICD-10-CM

## 2012-11-15 DIAGNOSIS — Z23 Encounter for immunization: Secondary | ICD-10-CM

## 2012-11-15 DIAGNOSIS — Z21 Asymptomatic human immunodeficiency virus [HIV] infection status: Secondary | ICD-10-CM

## 2012-11-15 DIAGNOSIS — Z131 Encounter for screening for diabetes mellitus: Secondary | ICD-10-CM

## 2012-11-15 DIAGNOSIS — F319 Bipolar disorder, unspecified: Secondary | ICD-10-CM

## 2012-11-15 DIAGNOSIS — Z0000 Encounter for general adult medical examination without abnormal findings: Secondary | ICD-10-CM

## 2012-11-15 DIAGNOSIS — Z130 Encounter for screening for diseases of the blood and blood-forming organs and certain disorders involving the immune mechanism: Secondary | ICD-10-CM

## 2012-11-15 DIAGNOSIS — B20 Human immunodeficiency virus [HIV] disease: Secondary | ICD-10-CM

## 2012-11-15 DIAGNOSIS — Z13220 Encounter for screening for lipoid disorders: Secondary | ICD-10-CM

## 2012-11-15 LAB — LIPID PANEL
Cholesterol: 149 mg/dL (ref 0–200)
HDL: 43.1 mg/dL (ref >39.00)
Triglycerides: 100 mg/dL (ref 0.0–149.0)
VLDL: 20 mg/dL (ref 0.0–40.0)

## 2012-11-15 LAB — CBC
HCT: 43.6 % (ref 39.0–52.0)
MCV: 92.7 fl (ref 78.0–100.0)
RDW: 14.9 % — ABNORMAL HIGH (ref 11.5–14.6)

## 2012-11-15 LAB — BASIC METABOLIC PANEL
Calcium: 9 mg/dL (ref 8.4–10.5)
GFR: 120.64 mL/min (ref >60.00)
Potassium: 4.1 mEq/L (ref 3.5–5.1)
Sodium: 138 mEq/L (ref 135–145)

## 2012-11-15 NOTE — Patient Instructions (Signed)
Health Maintenance, Males A healthy lifestyle and preventative care can promote health and wellness.  Maintain regular health, dental, and eye exams.  Eat a healthy diet. Foods like vegetables, fruits, whole grains, low-fat dairy products, and lean protein foods contain the nutrients you need without too many calories. Decrease your intake of foods high in solid fats, added sugars, and salt. Get information about a proper diet from your caregiver, if necessary.  Regular physical exercise is one of the most important things you can do for your health. Most adults should get at least 150 minutes of moderate-intensity exercise (any activity that increases your heart rate and causes you to sweat) each week. In addition, most adults need muscle-strengthening exercises on 2 or more days a week.   Maintain a healthy weight. The body mass index (BMI) is a screening tool to identify possible weight problems. It provides an estimate of body fat based on height and weight. Your caregiver can help determine your BMI, and can help you achieve or maintain a healthy weight. For adults 20 years and older:  A BMI below 18.5 is considered underweight.  A BMI of 18.5 to 24.9 is normal.  A BMI of 25 to 29.9 is considered overweight.  A BMI of 30 and above is considered obese.  Maintain normal blood lipids and cholesterol by exercising and minimizing your intake of saturated fat. Eat a balanced diet with plenty of fruits and vegetables. Blood tests for lipids and cholesterol should begin at age 20 and be repeated every 5 years. If your lipid or cholesterol levels are high, you are over 50, or you are a high risk for heart disease, you may need your cholesterol levels checked more frequently.Ongoing high lipid and cholesterol levels should be treated with medicines, if diet and exercise are not effective.  If you smoke, find out from your caregiver how to quit. If you do not use tobacco, do not start.  If you  choose to drink alcohol, do not exceed 2 drinks per day. One drink is considered to be 12 ounces (355 mL) of beer, 5 ounces (148 mL) of wine, or 1.5 ounces (44 mL) of liquor.  Avoid use of street drugs. Do not share needles with anyone. Ask for help if you need support or instructions about stopping the use of drugs.  High blood pressure causes heart disease and increases the risk of stroke. Blood pressure should be checked at least every 1 to 2 years. Ongoing high blood pressure should be treated with medicines if weight loss and exercise are not effective.  If you are 45 to 51 years old, ask your caregiver if you should take aspirin to prevent heart disease.  Diabetes screening involves taking a blood sample to check your fasting blood sugar level. This should be done once every 3 years, after age 45, if you are within normal weight and without risk factors for diabetes. Testing should be considered at a younger age or be carried out more frequently if you are overweight and have at least 1 risk factor for diabetes.  Colorectal cancer can be detected and often prevented. Most routine colorectal cancer screening begins at the age of 50 and continues through age 75. However, your caregiver may recommend screening at an earlier age if you have risk factors for colon cancer. On a yearly basis, your caregiver may provide home test kits to check for hidden blood in the stool. Use of a small camera at the end of a tube,   to directly examine the colon (sigmoidoscopy or colonoscopy), can detect the earliest forms of colorectal cancer. Talk to your caregiver about this at age 50, when routine screening begins. Direct examination of the colon should be repeated every 5 to 10 years through age 75, unless early forms of pre-cancerous polyps or small growths are found.  Hepatitis C blood testing is recommended for all people born from 1945 through 1965 and any individual with known risks for hepatitis C.  Healthy  men should no longer receive prostate-specific antigen (PSA) blood tests as part of routine cancer screening. Consult with your caregiver about prostate cancer screening.  Testicular cancer screening is not recommended for adolescents or adult males who have no symptoms. Screening includes self-exam, caregiver exam, and other screening tests. Consult with your caregiver about any symptoms you have or any concerns you have about testicular cancer.  Practice safe sex. Use condoms and avoid high-risk sexual practices to reduce the spread of sexually transmitted infections (STIs).  Use sunscreen with a sun protection factor (SPF) of 30 or greater. Apply sunscreen liberally and repeatedly throughout the day. You should seek shade when your shadow is shorter than you. Protect yourself by wearing long sleeves, pants, a wide-brimmed hat, and sunglasses year round, whenever you are outdoors.  Notify your caregiver of new moles or changes in moles, especially if there is a change in shape or color. Also notify your caregiver if a mole is larger than the size of a pencil eraser.  A one-time screening for abdominal aortic aneurysm (AAA) and surgical repair of large AAAs by sound wave imaging (ultrasonography) is recommended for ages 65 to 75 years who are current or former smokers.  Stay current with your immunizations. Document Released: 03/24/2008 Document Revised: 12/19/2011 Document Reviewed: 02/21/2011 Exit[employer] Patient Information 2013 [employer].  

## 2012-11-15 NOTE — Progress Notes (Signed)
HPI  Pt presents to the clinic today to establish care. He has never been seen by a PCP before. He has no concerns today. He does see Dr. Orvan Falconer at Infectious Disease for his HIV infection. He does go to [facility] for his Bipolar and Schizophrenia.  Flu: 2013 Pneumovax: 2010 Tetanus: more than 10 year Eye doctor: never Dentist: never Colon Screening: never  Past Medical History  Diagnosis Date  . HIV infection   . Bipolar 1 disorder 05/13/2011  . Cigarette smoker 05/26/2011    Have asked him to continue consideration of quitting smoking completely.   Marland Kitchen HIV (human immunodeficiency virus infection) 05/13/2011  . Rash, skin 05/13/2011    Bilateral arm    . Schizo affective schizophrenia 05/13/2011    Current Outpatient Prescriptions  Medication Sig Dispense Refill  . emtricitabine-tenofovir (TRUVADA) 200-300 MG per tablet Take 1 tablet by mouth daily.  30 tablet  prn  . ISENTRESS 400 MG tablet TAKE 1 TABLET BY MOUTH 2 TIMES DAILY.  60 tablet  11  . triamcinolone cream (KENALOG) 0.1 % Apply 1 application topically 2 (two) times daily.  454 g  11  . ZYPREXA 20 MG tablet TAKE 2 TABLETS AT BEDTIME  60 tablet  3    No Known Allergies  Family History  Problem Relation Age of Onset  . Diabetes Mother   . Hypertension Mother   . Hyperlipidemia Mother   . Stroke Mother   . Diabetes Father   . Hypertension Father   . Hyperlipidemia Father   . Diabetes Sister   . Hypertension Sister   . Hyperlipidemia Sister   . Stroke Sister   . Diabetes Brother   . Hypertension Brother   . Hyperlipidemia Brother   . Cancer Maternal Grandmother     History   Social History  . Marital Status: Single    Spouse Name: N/A    Number of Children: N/A  . Years of Education: N/A   Occupational History  . Not on file.   Social History Main Topics  . Smoking status: Current Every Day Smoker -- 1.0 packs/day for 32 years    Types: Cigarettes  . Smokeless tobacco: Never Used  . Alcohol Use: 1.0  oz/week    2 drink(s) per week     Comment: a 5th every 2 days, but not every 2 days  . Drug Use: No  . Sexually Active: No     Comment: declined condoms   Other Topics Concern  . Not on file   Social History Narrative  . No narrative on file    ROS:  Constitutional: Denies fever, malaise, fatigue, headache or abrupt weight changes.  HEENT: Denies eye pain, eye redness, ear pain, ringing in the ears, wax buildup, runny nose, nasal congestion, bloody nose, or sore throat. Respiratory: Denies difficulty breathing, shortness of breath, cough or sputum production.   Cardiovascular: Denies chest pain, chest tightness, palpitations or swelling in the hands or feet.  Gastrointestinal: Denies abdominal pain, bloating, constipation, diarrhea or blood in the stool.  GU: Denies frequency, urgency, pain with urination, blood in urine, odor or discharge. Musculoskeletal: Denies decrease in range of motion, difficulty with gait, muscle pain or joint pain and swelling.  Skin: Denies redness, rashes, lesions or ulcercations.  Neurological: Denies dizziness, difficulty with memory, difficulty with speech or problems with balance and coordination.   No other specific complaints in a complete review of systems (except as listed in HPI above).  PE:  BP 128/82  Pulse 57  Temp 97.6 F (36.4 C) (Oral)  Ht 6' (1.829 m)  Wt 244 lb 3.2 oz (110.768 kg)  BMI 33.12 kg/m2  SpO2 98% Wt Readings from Last 3 Encounters:  11/15/12 244 lb 3.2 oz (110.768 kg)  10/25/12 241 lb 12 oz (109.657 kg)  08/08/12 227 lb 8 oz (103.193 kg)    General: Appears his stated age, overweight but well developed, well nourished in NAD. HEENT: Head: normal shape and size; Eyes: sclera white, no icterus, conjunctiva pink, PERRLA and EOMs intact; Ears: Tm's gray and intact, normal light reflex; Nose: mucosa pink and moist, septum midline; Throat/Mouth: Teeth present, mucosa pink and moist, no lesions or ulcerations noted.   Neck: Normal range of motion. Neck supple, trachea midline. No massses, lumps or thyromegaly present.  Cardiovascular: Normal rate and rhythm. S1,S2 noted.  No murmur, rubs or gallops noted. No JVD or BLE edema. No carotid bruits noted. Pulmonary/Chest: Normal effort and positive vesicular breath sounds. No respiratory distress. No wheezes, rales or ronchi noted.  Abdomen: Soft and nontender. Normal bowel sounds, no bruits noted. No distention or masses noted. Liver, spleen and kidneys non palpable. Musculoskeletal: Normal range of motion. No signs of joint swelling. No difficulty with gait.  Neurological: Alert and oriented. Cranial nerves II-XII intact. Coordination normal. +DTRs bilaterally. Psychiatric: Mood and affect normal. Behavior is normal. Judgment and thought content normal.     Assessment and Plan:  Prevent health maintenance:  Start a diet and exercise program Smoking Cessation counseling given Will obtain basic labs today Tdap given today  Schizophrenia and Bipolar disorder:  Continue counseling at [facility] Continue Zyprexa  HIV infection: Continue to follow with Dr. Karna Christmas  RTC in 1 year or sooner if needed

## 2013-01-16 ENCOUNTER — Encounter: Payer: Self-pay | Admitting: *Deleted

## 2013-03-14 ENCOUNTER — Ambulatory Visit: Payer: Federal, State, Local not specified - PPO | Admitting: Internal Medicine

## 2013-04-02 ENCOUNTER — Other Ambulatory Visit: Payer: Self-pay | Admitting: Licensed Clinical Social Worker

## 2013-04-02 DIAGNOSIS — F29 Unspecified psychosis not due to a substance or known physiological condition: Secondary | ICD-10-CM

## 2013-04-02 MED ORDER — OLANZAPINE 20 MG PO TABS: 20.0000 mg | ORAL_TABLET | Freq: Every day | ORAL | Status: DC

## 2013-04-16 ENCOUNTER — Telehealth: Payer: Self-pay | Admitting: *Deleted

## 2013-04-16 ENCOUNTER — Ambulatory Visit: Payer: Federal, State, Local not specified - PPO | Admitting: Internal Medicine

## 2013-04-16 NOTE — Telephone Encounter (Signed)
Pt has had 3 "No Show" appts in the last 9 months.  Left message with information about "Walk-in Clinic" times and days.  Advised pt that he may be seen by one of Dr. Blair Dolphin partners when he "walks-in."

## 2013-05-02 ENCOUNTER — Other Ambulatory Visit: Payer: Self-pay | Admitting: *Deleted

## 2013-05-02 DIAGNOSIS — B20 Human immunodeficiency virus [HIV] disease: Secondary | ICD-10-CM

## 2013-05-02 MED ORDER — EMTRICITABINE-TENOFOVIR DF 200-300 MG PO TABS: 1.0000 | ORAL_TABLET | Freq: Every day | ORAL | Status: DC

## 2013-05-02 MED ORDER — RALTEGRAVIR POTASSIUM 400 MG PO TABS: ORAL_TABLET | ORAL | Status: DC

## 2013-05-02 NOTE — Telephone Encounter (Signed)
Activated Co-Pay cards for HIV rxes.

## 2013-05-09 ENCOUNTER — Other Ambulatory Visit: Payer: Self-pay | Admitting: *Deleted

## 2013-05-09 DIAGNOSIS — F29 Unspecified psychosis not due to a substance or known physiological condition: Secondary | ICD-10-CM

## 2013-05-09 MED ORDER — OLANZAPINE 20 MG PO TABS: 40.0000 mg | ORAL_TABLET | Freq: Every day | ORAL | Status: AC

## 2013-12-25 ENCOUNTER — Other Ambulatory Visit: Payer: Federal, State, Local not specified - PPO

## 2013-12-25 DIAGNOSIS — B20 Human immunodeficiency virus [HIV] disease: Secondary | ICD-10-CM

## 2013-12-25 DIAGNOSIS — Z79899 Other long term (current) drug therapy: Secondary | ICD-10-CM

## 2013-12-25 DIAGNOSIS — Z113 Encounter for screening for infections with a predominantly sexual mode of transmission: Secondary | ICD-10-CM

## 2013-12-25 LAB — CBC WITH DIFFERENTIAL/PLATELET
BASOS ABS: 0.1 10*3/uL (ref 0.0–0.1)
BASOS PCT: 1 % (ref 0–1)
EOS ABS: 0.1 10*3/uL (ref 0.0–0.7)
EOS PCT: 2 % (ref 0–5)
HCT: 44.5 % (ref 39.0–52.0)
Hemoglobin: 16.2 g/dL (ref 13.0–17.0)
LYMPHS ABS: 2.9 10*3/uL (ref 0.7–4.0)
Lymphocytes Relative: 47 % — ABNORMAL HIGH (ref 12–46)
MCH: 30.5 pg (ref 26.0–34.0)
MCHC: 36.4 g/dL — AB (ref 30.0–36.0)
MCV: 83.8 fL (ref 78.0–100.0)
Monocytes Absolute: 0.5 10*3/uL (ref 0.1–1.0)
Monocytes Relative: 8 % (ref 3–12)
NEUTROS PCT: 42 % — AB (ref 43–77)
Neutro Abs: 2.6 10*3/uL (ref 1.7–7.7)
PLATELETS: 216 10*3/uL (ref 150–400)
RBC: 5.31 MIL/uL (ref 4.22–5.81)
RDW: 15.9 % — AB (ref 11.5–15.5)
WBC: 6.2 10*3/uL (ref 4.0–10.5)

## 2013-12-25 LAB — COMPLETE METABOLIC PANEL WITH GFR
ALK PHOS: 81 U/L (ref 39–117)
ALT: 10 U/L (ref 0–53)
AST: 16 U/L (ref 0–37)
Albumin: 4 g/dL (ref 3.5–5.2)
BILIRUBIN TOTAL: 0.4 mg/dL (ref 0.2–1.2)
BUN: 14 mg/dL (ref 6–23)
CO2: 26 meq/L (ref 19–32)
CREATININE: 0.8 mg/dL (ref 0.50–1.35)
Calcium: 9.2 mg/dL (ref 8.4–10.5)
Chloride: 104 mEq/L (ref 96–112)
GFR, Est Non African American: >89 mL/min
Glucose, Bld: 93 mg/dL (ref 70–99)
Potassium: 4.3 mEq/L (ref 3.5–5.3)
Sodium: 136 mEq/L (ref 135–145)
Total Protein: 7.4 g/dL (ref 6.0–8.3)

## 2013-12-25 LAB — LIPID PANEL
CHOL/HDL RATIO: 4.2 ratio
CHOLESTEROL: 185 mg/dL (ref 0–200)
HDL: 44 mg/dL (ref >39)
LDL Cholesterol: 114 mg/dL — ABNORMAL HIGH (ref 0–99)
TRIGLYCERIDES: 134 mg/dL (ref <150)
VLDL: 27 mg/dL (ref 0–40)

## 2013-12-26 LAB — RPR

## 2013-12-26 LAB — T-HELPER CELL (CD4) - (RCID CLINIC ONLY)
CD4 T CELL ABS: 280 /uL — AB (ref 400–2700)
CD4 T CELL HELPER: 9 % — AB (ref 33–55)

## 2013-12-27 LAB — HIV-1 RNA QUANT-NO REFLEX-BLD: HIV 1 RNA Quant: <20 copies/mL (ref <20)

## 2014-01-08 DIAGNOSIS — E785 Hyperlipidemia, unspecified: Secondary | ICD-10-CM | POA: Insufficient documentation

## 2014-01-09 ENCOUNTER — Encounter: Payer: Self-pay | Admitting: Internal Medicine

## 2014-01-09 ENCOUNTER — Ambulatory Visit (INDEPENDENT_AMBULATORY_CARE_PROVIDER_SITE_OTHER): Payer: Federal, State, Local not specified - PPO | Admitting: Internal Medicine

## 2014-01-09 VITALS — BP 143/84 | HR 93 | Temp 98.3°F | Ht 72.0 in | Wt 223.0 lb

## 2014-01-09 DIAGNOSIS — E785 Hyperlipidemia, unspecified: Secondary | ICD-10-CM

## 2014-01-09 NOTE — Progress Notes (Signed)
Patient ID: Randall Garrett, male   DOB: [birth year], 53 y.o.   MRN: [id number] @LOGODEPT         Patient Active Problem List   Diagnosis Date Noted  . HIV (human immunodeficiency virus infection) 05/13/2011    Priority: High  . Schizo affective schizophrenia 05/13/2011    Priority: High  . Dyslipidemia 01/08/2014  . Cigarette smoker 05/26/2011  . Bipolar 1 disorder 05/13/2011  . Rash, skin 05/13/2011    Patient's Medications  New Prescriptions   No medications on file  Previous Medications   EMTRICITABINE-TENOFOVIR (TRUVADA) 200-300 MG PER TABLET    Take 1 tablet by mouth daily.   OLANZAPINE (ZYPREXA) 20 MG TABLET    Take 2 tablets (40 mg total) by mouth at bedtime.   RALTEGRAVIR (ISENTRESS) 400 MG TABLET    TAKE 1 TABLET BY MOUTH 2 TIMES DAILY.   TRIAMCINOLONE CREAM (KENALOG) 0.1 %    Apply 1 application topically 2 (two) times daily.  Modified Medications   No medications on file  Discontinued Medications   No medications on file    Subjective: Randall MaduroRobert is in for his first visit since October of 2013. He is unaware that his been that long. He states that he has been taking his medications faithfully and has not missed any doses. He is feeling well. He states that he has cut out all street drugs several years ago and has been cutting down on his alcohol. He continues to smoke cigarettes. In the last year he has traveled to [place] and [place] on vacation.  Review of Systems: Pertinent items are noted in HPI.  Past Medical History  Diagnosis Date  . HIV infection   . Bipolar 1 disorder 05/13/2011  . Cigarette smoker 05/26/2011    Have asked him to continue consideration of quitting smoking completely.   Marland Kitchen. HIV (human immunodeficiency virus infection) 05/13/2011  . Rash, skin 05/13/2011    Bilateral arm    . Schizo affective schizophrenia 05/13/2011    History  Substance Use Topics  . Smoking status: Current Every Day Smoker -- 1.00 packs/day for 32 years    Types: Cigarettes   . Smokeless tobacco: Never Used     Comment: going to try to quit this year  . Alcohol Use: 1.0 oz/week    2 drink(s) per week     Comment: a 5th every 2 days, but not every 2 days    Family History  Problem Relation Age of Onset  . Diabetes Mother   . Hypertension Mother   . Hyperlipidemia Mother   . Stroke Mother   . Diabetes Father   . Hypertension Father   . Hyperlipidemia Father   . Diabetes Sister   . Hypertension Sister   . Hyperlipidemia Sister   . Stroke Sister   . Diabetes Brother   . Hypertension Brother   . Hyperlipidemia Brother   . Cancer Maternal Grandmother     No Known Allergies  Objective: Temp: 98.3 F (36.8 C) (04/02 1052) Temp src: Oral (04/02 1052) BP: 143/84 mmHg (04/02 1052) Pulse Rate: 93 (04/02 1052) Body mass index is 30.24 kg/(m^2).  General: He is very talkative and in good spirits as usual Oral: No oropharyngeal lesions Skin: No rash Lungs: Clear Cor: Regular S1-S2 no murmurs  Lab Results Lab Results  Component Value Date   WBC 6.2 12/25/2013   HGB 16.2 12/25/2013   HCT 44.5 12/25/2013   MCV 83.8 12/25/2013   PLT 216 12/25/2013  Lab Results  Component Value Date   CREATININE 0.80 12/25/2013   BUN 14 12/25/2013   NA 136 12/25/2013   K 4.3 12/25/2013   CL 104 12/25/2013   CO2 26 12/25/2013    Lab Results  Component Value Date   ALT 10 12/25/2013   AST 16 12/25/2013   ALKPHOS 81 12/25/2013   BILITOT 0.4 12/25/2013    Lab Results  Component Value Date   CHOL 185 12/25/2013   HDL 44 12/25/2013   LDLCALC 114* 12/25/2013   TRIG 134 12/25/2013   CHOLHDL 4.2 12/25/2013    Lab Results HIV 1 RNA Quant (copies/mL)  Date Value  12/25/2013 <20   08/08/2012 <20   02/21/2012 <20      CD4 T Cell Abs (/uL)  Date Value  12/25/2013 280*  08/08/2012 290*  08/15/2011 210*     Assessment: His infection remains under very good control on his current regimen. I offered him the option of simplifying his regimen but he is a creacher of habit  and does not want to change. I encouraged him to keep her regular visits at six-month intervals.  I talked to him about the importance of cigarette cessation. He indicates that he will think about trying to quit.  Plan: 1. Continue Truvada and Isentress 2. Cigarette cessation counseling provided 3. He refuses vaccinations again 4. Followup after lab work in 6 months   Cliffton Asters, MD [facility] [phone] pager   [phone] cell 01/09/2014, 11:19 AM

## 2014-05-06 ENCOUNTER — Other Ambulatory Visit: Payer: Self-pay | Admitting: Licensed Clinical Social Worker

## 2014-05-06 DIAGNOSIS — B20 Human immunodeficiency virus [HIV] disease: Secondary | ICD-10-CM

## 2014-05-06 MED ORDER — EMTRICITABINE-TENOFOVIR DF 200-300 MG PO TABS: 1.0000 | ORAL_TABLET | Freq: Every day | ORAL | Status: DC

## 2014-05-06 MED ORDER — RALTEGRAVIR POTASSIUM 400 MG PO TABS: ORAL_TABLET | ORAL | Status: DC

## 2014-06-30 ENCOUNTER — Ambulatory Visit (INDEPENDENT_AMBULATORY_CARE_PROVIDER_SITE_OTHER): Payer: Federal, State, Local not specified - PPO | Admitting: Internal Medicine

## 2014-06-30 ENCOUNTER — Encounter: Payer: Self-pay | Admitting: Internal Medicine

## 2014-06-30 ENCOUNTER — Telehealth: Payer: Self-pay | Admitting: *Deleted

## 2014-06-30 VITALS — BP 123/82 | HR 59 | Temp 98.1°F | Ht 73.0 in | Wt 235.0 lb

## 2014-06-30 DIAGNOSIS — B029 Zoster without complications: Secondary | ICD-10-CM

## 2014-06-30 DIAGNOSIS — Z21 Asymptomatic human immunodeficiency virus [HIV] infection status: Secondary | ICD-10-CM | POA: Diagnosis not present

## 2014-06-30 DIAGNOSIS — B20 Human immunodeficiency virus [HIV] disease: Secondary | ICD-10-CM

## 2014-06-30 MED ORDER — VALACYCLOVIR HCL 1 G PO TABS: 1000.0000 mg | ORAL_TABLET | Freq: Three times a day (TID) | ORAL | Status: DC

## 2014-06-30 NOTE — Telephone Encounter (Signed)
Patient walked into clinic c/o itchy blistery rash on right arm. He states he did not work in the yard or do anything out of his normal routine. Attempted to call his PCP at [facility] and could not get anyone to answer the phone. Added to Dr. Feliz Beam schedule for an opening today at 9:45 AM. Randall Garrett

## 2014-06-30 NOTE — Progress Notes (Signed)
Patient ID: Randall Garrett, male   DOB: Jun 07, 1961, 53 y.o.   MRN: [id number]       Patient ID: Randall Garrett, male   DOB: December 18, 1960, 53 y.o.   MRN: [id number]  HPI 53 yo M with HIV, CD 4 count of 280/VL <20. On RLG/truvada. Has 4 day history of vesicular rash to his right arm. Not painful but itchy. No fevers.  Outpatient Encounter Prescriptions as of 06/30/2014  Medication Sig  . emtricitabine-tenofovir (TRUVADA) 200-300 MG per tablet Take 1 tablet by mouth daily.  Marland Kitchen OLANZapine (ZYPREXA) 20 MG tablet Take 2 tablets (40 mg total) by mouth at bedtime.  . raltegravir (ISENTRESS) 400 MG tablet TAKE 1 TABLET BY MOUTH 2 TIMES DAILY.  Marland Kitchen triamcinolone cream (KENALOG) 0.1 % Apply 1 application topically 2 (two) times daily.     Patient Active Problem List   Diagnosis Date Noted  . Dyslipidemia 01/08/2014  . Cigarette smoker 05/26/2011  . Bipolar 1 disorder 05/13/2011  . HIV (human immunodeficiency virus infection) 05/13/2011  . Schizo affective schizophrenia 05/13/2011  . Rash, skin 05/13/2011     Health Maintenance Due  Topic Date Due  . Colonoscopy  01/03/2012  . Influenza Vaccine  05/10/2014     Review of Systems Rash per hpi, otherwise 10 point ros is negative Physical Exam   BP 123/82  Pulse 59  Temp(Src) 98.1 F (36.7 C) (Oral)  Ht  (1.854 m)  Wt 235 lb (106.595 kg)  BMI 31.01 kg/m2  Constitutional: He is oriented to person, place, and time. He appears well-developed and well-nourished. No distress.  Lymphadenopathy:  He has no cervical adenopathy.  Neurological: He is alert and oriented to person, place, and time.  Skin: small clusters of vesicle rash to right arm in T1 distribution Psychiatric: He has a normal mood and affect. His behavior is normal.    Lab Results  Component Value Date   CD4TCELL 9* 12/25/2013   Lab Results  Component Value Date   CD4TABS 280* 12/25/2013   CD4TABS 290* 08/08/2012   CD4TABS 210* 08/15/2011   Lab Results  Component  Value Date   HIV1RNAQUANT <20 12/25/2013   Lab Results  Component Value Date   HEPBSAB NEG 05/12/2011   No results found for this basename: RPR    CBC Lab Results  Component Value Date   WBC 6.2 12/25/2013   RBC 5.31 12/25/2013   HGB 16.2 12/25/2013   HCT 44.5 12/25/2013   PLT 216 12/25/2013   MCV 83.8 12/25/2013   MCH 30.5 12/25/2013   MCHC 36.4* 12/25/2013   RDW 15.9* 12/25/2013   LYMPHSABS 2.9 12/25/2013   MONOABS 0.5 12/25/2013   EOSABS 0.1 12/25/2013   BASOSABS 0.1 12/25/2013   BMET Lab Results  Component Value Date   NA 136 12/25/2013   K 4.3 12/25/2013   CL 104 12/25/2013   CO2 26 12/25/2013   GLUCOSE 93 12/25/2013   BUN 14 12/25/2013   CREATININE 0.80 12/25/2013   CALCIUM 9.2 12/25/2013   GFRNONAA >89 12/25/2013   GFRAA >89 12/25/2013     Assessment and Plan   hiv = well controlled continue on raltegravir and truvada  Zoster = will give valtrex 1gm TID X 10 days to help dry out lesions. Does not appear to have 2nd bacterial infection  Health maintenance = post pone flu shot until next visit  rtc in 7-10 d to ensure it is improved

## 2014-07-09 ENCOUNTER — Ambulatory Visit (INDEPENDENT_AMBULATORY_CARE_PROVIDER_SITE_OTHER): Payer: Federal, State, Local not specified - PPO | Admitting: Internal Medicine

## 2014-07-09 ENCOUNTER — Encounter: Payer: Self-pay | Admitting: Internal Medicine

## 2014-07-09 VITALS — BP 127/80 | HR 73 | Temp 98.2°F | Wt 234.0 lb

## 2014-07-09 DIAGNOSIS — Z23 Encounter for immunization: Secondary | ICD-10-CM

## 2014-07-09 NOTE — Progress Notes (Signed)
Patient ID: Randall Garrett, male   DOB: November 27, 1960, 53 y.o.   MRN: [id number]       Patient ID: Randall Garrett, male   DOB: November 27, 1960, 53 y.o.   MRN: [id number]  HPI Randall Garrett is a 53yo M with HIV, well controlled, CD 4 count of 320/VL<20, on RLG/Truvada. With good adherence. Saw him last week with outbreak of shingles to right arm. He was started on valtrex, lesions have crusted over, no pain, but was itchy.   Outpatient Encounter Prescriptions as of 07/09/2014  Medication Sig  . emtricitabine-tenofovir (TRUVADA) 200-300 MG per tablet Take 1 tablet by mouth daily.  Marland Kitchen. OLANZapine (ZYPREXA) 20 MG tablet Take 2 tablets (40 mg total) by mouth at bedtime.  . raltegravir (ISENTRESS) 400 MG tablet TAKE 1 TABLET BY MOUTH 2 TIMES DAILY.  Marland Kitchen. triamcinolone cream (KENALOG) 0.1 % Apply 1 application topically 2 (two) times daily.  . valACYclovir (VALTREX) 1000 MG tablet Take 1 tablet (1,000 mg total) by mouth 3 (three) times daily.     Patient Active Problem List   Diagnosis Date Noted  . Dyslipidemia 01/08/2014  . Cigarette smoker 05/26/2011  . Bipolar 1 disorder 05/13/2011  . HIV (human immunodeficiency virus infection) 05/13/2011  . Schizo affective schizophrenia 05/13/2011  . Rash, skin 05/13/2011     Health Maintenance Due  Topic Date Due  . Colonoscopy  01/03/2012  . Influenza Vaccine  05/10/2014      Physical Exam   BP 127/80  Pulse 73  Temp(Src) 98.2 F (36.8 C) (Oral)  Wt 234 lb (106.142 kg) gen = a xo by 3 in NAD Skin = right arm lesions are healed.no vesicles, no 2nd bacterial infection/exudate  Lab Results  Component Value Date   CD4TCELL 9* 12/25/2013   Lab Results  Component Value Date   CD4TABS 280* 12/25/2013   CD4TABS 290* 08/08/2012   CD4TABS 210* 08/15/2011   Lab Results  Component Value Date   HIV1RNAQUANT <20 12/25/2013   Lab Results  Component Value Date   HEPBSAB NEG 05/12/2011   No results found for this basename: RPR    CBC Lab Results  Component  Value Date   WBC 6.2 12/25/2013   RBC 5.31 12/25/2013   HGB 16.2 12/25/2013   HCT 44.5 12/25/2013   PLT 216 12/25/2013   MCV 83.8 12/25/2013   MCH 30.5 12/25/2013   MCHC 36.4* 12/25/2013   RDW 15.9* 12/25/2013   LYMPHSABS 2.9 12/25/2013   MONOABS 0.5 12/25/2013   EOSABS 0.1 12/25/2013   BASOSABS 0.1 12/25/2013   BMET Lab Results  Component Value Date   NA 136 12/25/2013   K 4.3 12/25/2013   CL 104 12/25/2013   CO2 26 12/25/2013   GLUCOSE 93 12/25/2013   BUN 14 12/25/2013   CREATININE 0.80 12/25/2013   CALCIUM 9.2 12/25/2013   GFRNONAA >89 12/25/2013   GFRAA >89 12/25/2013     Assessment and Plan  Zoster = finish valtrex course. No further treatment needed. Will watch for post herpetic neuralgia  hiv = well controlled, continue with current regimen  Health maintenance = will give flu shot today

## 2014-07-21 ENCOUNTER — Encounter: Payer: Self-pay | Admitting: *Deleted

## 2014-07-21 ENCOUNTER — Other Ambulatory Visit: Payer: Federal, State, Local not specified - PPO

## 2014-07-21 DIAGNOSIS — Z113 Encounter for screening for infections with a predominantly sexual mode of transmission: Secondary | ICD-10-CM

## 2014-07-21 DIAGNOSIS — B20 Human immunodeficiency virus [HIV] disease: Secondary | ICD-10-CM

## 2014-07-21 LAB — CBC WITH DIFFERENTIAL/PLATELET
Basophils Absolute: 0.1 10*3/uL (ref 0.0–0.1)
Basophils Relative: 1 % (ref 0–1)
EOS ABS: 0.1 10*3/uL (ref 0.0–0.7)
Eosinophils Relative: 2 % (ref 0–5)
HEMATOCRIT: 42.8 % (ref 39.0–52.0)
HEMOGLOBIN: 15.6 g/dL (ref 13.0–17.0)
Lymphocytes Relative: 51 % — ABNORMAL HIGH (ref 12–46)
Lymphs Abs: 2.6 10*3/uL (ref 0.7–4.0)
MCH: 30.7 pg (ref 26.0–34.0)
MCHC: 36.4 g/dL — ABNORMAL HIGH (ref 30.0–36.0)
MCV: 84.3 fL (ref 78.0–100.0)
MONO ABS: 0.5 10*3/uL (ref 0.1–1.0)
MONOS PCT: 10 % (ref 3–12)
NEUTROS PCT: 36 % — AB (ref 43–77)
Neutro Abs: 1.8 10*3/uL (ref 1.7–7.7)
Platelets: 221 10*3/uL (ref 150–400)
RBC: 5.08 MIL/uL (ref 4.22–5.81)
RDW: 15.6 % — ABNORMAL HIGH (ref 11.5–15.5)
WBC: 5.1 10*3/uL (ref 4.0–10.5)

## 2014-07-21 LAB — COMPLETE METABOLIC PANEL WITH GFR
ALK PHOS: 70 U/L (ref 39–117)
ALT: 11 U/L (ref 0–53)
AST: 15 U/L (ref 0–37)
Albumin: 4 g/dL (ref 3.5–5.2)
BUN: 10 mg/dL (ref 6–23)
CO2: 19 mEq/L (ref 19–32)
CREATININE: 0.89 mg/dL (ref 0.50–1.35)
Calcium: 8.8 mg/dL (ref 8.4–10.5)
Chloride: 106 mEq/L (ref 96–112)
GFR, Est African American: >89 mL/min
GFR, Est Non African American: >89 mL/min
Glucose, Bld: 86 mg/dL (ref 70–99)
Potassium: 4.2 mEq/L (ref 3.5–5.3)
SODIUM: 139 meq/L (ref 135–145)
Total Bilirubin: 0.3 mg/dL (ref 0.2–1.2)
Total Protein: 7.1 g/dL (ref 6.0–8.3)

## 2014-07-21 NOTE — Telephone Encounter (Signed)
Error

## 2014-07-22 LAB — HIV-1 RNA QUANT-NO REFLEX-BLD
HIV 1 RNA Quant: 44 copies/mL — ABNORMAL HIGH (ref <20)
HIV-1 RNA Quant, Log: 1.64 {Log} — ABNORMAL HIGH (ref <1.30)

## 2014-07-22 LAB — T-HELPER CELL (CD4) - (RCID CLINIC ONLY)
CD4 T CELL HELPER: 13 % — AB (ref 33–55)
CD4 T Cell Abs: 370 /uL — ABNORMAL LOW (ref 400–2700)

## 2014-08-04 ENCOUNTER — Ambulatory Visit (INDEPENDENT_AMBULATORY_CARE_PROVIDER_SITE_OTHER): Payer: Federal, State, Local not specified - PPO | Admitting: Internal Medicine

## 2014-08-04 ENCOUNTER — Encounter: Payer: Self-pay | Admitting: Internal Medicine

## 2014-08-04 VITALS — BP 131/83 | HR 69 | Temp 98.0°F | Ht 73.0 in | Wt 233.0 lb

## 2014-08-04 DIAGNOSIS — B20 Human immunodeficiency virus [HIV] disease: Secondary | ICD-10-CM

## 2014-08-04 DIAGNOSIS — Z21 Asymptomatic human immunodeficiency virus [HIV] infection status: Secondary | ICD-10-CM | POA: Diagnosis not present

## 2014-08-04 NOTE — Progress Notes (Signed)
Patient ID: Randall Garrett, male   DOB: 01/12/1961, 53 y.o.   MRN: [id number]          Patient Active Problem List   Diagnosis Date Noted  . HIV (human immunodeficiency virus infection) 05/13/2011    Priority: High  . Schizo affective schizophrenia 05/13/2011    Priority: High  . Dyslipidemia 01/08/2014  . Cigarette smoker 05/26/2011  . Bipolar 1 disorder 05/13/2011  . Rash, skin 05/13/2011    Patient's Medications  New Prescriptions   No medications on file  Previous Medications   EMTRICITABINE-TENOFOVIR (TRUVADA) 200-300 MG PER TABLET    Take 1 tablet by mouth daily.   OLANZAPINE (ZYPREXA) 20 MG TABLET    Take 2 tablets (40 mg total) by mouth at bedtime.   RALTEGRAVIR (ISENTRESS) 400 MG TABLET    TAKE 1 TABLET BY MOUTH 2 TIMES DAILY.   TRIAMCINOLONE CREAM (KENALOG) 0.1 %    Apply 1 application topically 2 (two) times daily.  Modified Medications   No medications on file  Discontinued Medications   VALACYCLOVIR (VALTREX) 1000 MG TABLET    Take 1 tablet (1,000 mg total) by mouth 3 (three) times daily.    Subjective: Randall Garrett is in for his routine visit. He has not missed any doses of his Truvada or Isentress. He recently developed shingles on his right arm and was treated with valacyclovir. His pain has resolved. He continues to smoke cigarettes but hopes to quit this year. He has not seen his primary care provider in over a year. He states that his father had a "massive heart attack" at age 52 and that motivates him to want to quit smoking. Review of Systems: Pertinent items are noted in HPI.  Past Medical History  Diagnosis Date  . HIV infection   . Bipolar 1 disorder 05/13/2011  . Cigarette smoker 05/26/2011    Have asked him to continue consideration of quitting smoking completely.   Marland Kitchen. HIV (human immunodeficiency virus infection) 05/13/2011  . Rash, skin 05/13/2011    Bilateral arm    . Schizo affective schizophrenia 05/13/2011    History  Substance Use Topics  . Smoking  status: Current Every Day Smoker -- 1.00 packs/day for 32 years    Types: Cigarettes  . Smokeless tobacco: Never Used     Comment: going to try to quit this year before 2016  . Alcohol Use: 1.0 oz/week    2 drink(s) per week     Comment: a 5th every 2 days, but not every 2 days    Family History  Problem Relation Age of Onset  . Diabetes Mother   . Hypertension Mother   . Hyperlipidemia Mother   . Stroke Mother   . Diabetes Father   . Hypertension Father   . Hyperlipidemia Father   . Diabetes Sister   . Hypertension Sister   . Hyperlipidemia Sister   . Stroke Sister   . Diabetes Brother   . Hypertension Brother   . Hyperlipidemia Brother   . Cancer Maternal Grandmother     No Known Allergies  Objective: Temp: 98 F (36.7 C) (10/26 1455) Temp Source: Oral (10/26 1455) BP: 131/83 mmHg (10/26 1455) Pulse Rate: 69 (10/26 1455) Body mass index is 30.75 kg/(m^2).  General: He is smiling, talkative and in good spirits as usual Oral: No oropharyngeal lesions Skin: Healing shingles rash on right arm Lungs: Clear Cor: Regular S1 and S2 with no murmurs  Lab Results Lab Results  Component Value Date  WBC 5.1 07/21/2014   HGB 15.6 07/21/2014   HCT 42.8 07/21/2014   MCV 84.3 07/21/2014   PLT 221 07/21/2014    Lab Results  Component Value Date   CREATININE 0.89 07/21/2014   BUN 10 07/21/2014   NA 139 07/21/2014   K 4.2 07/21/2014   CL 106 07/21/2014   CO2 19 07/21/2014    Lab Results  Component Value Date   ALT 11 07/21/2014   AST 15 07/21/2014   ALKPHOS 70 07/21/2014   BILITOT 0.3 07/21/2014    Lab Results  Component Value Date   CHOL 185 12/25/2013   HDL 44 12/25/2013   LDLCALC 114* 12/25/2013   TRIG 134 12/25/2013   CHOLHDL 4.2 12/25/2013    Lab Results HIV 1 RNA Quant (copies/mL)  Date Value  07/21/2014 44*  12/25/2013 <20   08/08/2012 <20      CD4 T Cell Abs (/uL)  Date Value  07/21/2014 370*  12/25/2013 280*  08/08/2012 290*       Assessment: Su Hiltoberts HIV infection remains under good control. He has low-level viral activation that is probably related to the shingles and should self-correct. I will continue his current antiretroviral regimen and see him back in 6 months. I talked to him at length today about the importance of cigarette cessation given his family history, male sex, and dyslipidemia.  Plan: 1. Continue current antiretroviral regimen 2. Cigarette cessation counseling provided 3. Follow-up after lab work in 6 months 4. Encouraged him to follow-up with his primary care provider   Cliffton AstersJohn Caytlin Better, MD [facility] [phone] pager   [phone] cell 08/04/2014, 3:10 PM

## 2015-01-22 ENCOUNTER — Other Ambulatory Visit: Payer: Federal, State, Local not specified - PPO

## 2015-01-22 DIAGNOSIS — Z113 Encounter for screening for infections with a predominantly sexual mode of transmission: Secondary | ICD-10-CM

## 2015-01-22 DIAGNOSIS — Z79899 Other long term (current) drug therapy: Secondary | ICD-10-CM

## 2015-01-22 DIAGNOSIS — B20 Human immunodeficiency virus [HIV] disease: Secondary | ICD-10-CM

## 2015-01-23 LAB — CBC WITH DIFFERENTIAL/PLATELET
BASOS ABS: 0.1 10*3/uL (ref 0.0–0.1)
Basophils Relative: 1 % (ref 0–1)
Eosinophils Absolute: 0.1 10*3/uL (ref 0.0–0.7)
Eosinophils Relative: 1 % (ref 0–5)
HCT: 44.4 % (ref 39.0–52.0)
HEMOGLOBIN: 15.5 g/dL (ref 13.0–17.0)
LYMPHS PCT: 47 % — AB (ref 12–46)
Lymphs Abs: 3.3 10*3/uL (ref 0.7–4.0)
MCH: 30 pg (ref 26.0–34.0)
MCHC: 34.9 g/dL (ref 30.0–36.0)
MCV: 86 fL (ref 78.0–100.0)
MPV: 9.8 fL (ref 8.6–12.4)
Monocytes Absolute: 0.6 10*3/uL (ref 0.1–1.0)
Monocytes Relative: 8 % (ref 3–12)
NEUTROS ABS: 3 10*3/uL (ref 1.7–7.7)
Neutrophils Relative %: 43 % (ref 43–77)
PLATELETS: 235 10*3/uL (ref 150–400)
RBC: 5.16 MIL/uL (ref 4.22–5.81)
RDW: 15.7 % — AB (ref 11.5–15.5)
WBC: 7 10*3/uL (ref 4.0–10.5)

## 2015-01-23 LAB — LIPID PANEL
Cholesterol: 164 mg/dL (ref 0–200)
HDL: 43 mg/dL (ref >=40)
LDL CALC: 104 mg/dL — AB (ref 0–99)
Total CHOL/HDL Ratio: 3.8 Ratio
Triglycerides: 84 mg/dL (ref <150)
VLDL: 17 mg/dL (ref 0–40)

## 2015-01-23 LAB — COMPLETE METABOLIC PANEL WITH GFR
ALBUMIN: 4.1 g/dL (ref 3.5–5.2)
ALT: 14 U/L (ref 0–53)
AST: 18 U/L (ref 0–37)
Alkaline Phosphatase: 73 U/L (ref 39–117)
BUN: 10 mg/dL (ref 6–23)
CALCIUM: 9.1 mg/dL (ref 8.4–10.5)
CHLORIDE: 103 meq/L (ref 96–112)
CO2: 25 meq/L (ref 19–32)
CREATININE: 0.88 mg/dL (ref 0.50–1.35)
GLUCOSE: 84 mg/dL (ref 70–99)
POTASSIUM: 4.4 meq/L (ref 3.5–5.3)
Sodium: 138 mEq/L (ref 135–145)
Total Bilirubin: 0.4 mg/dL (ref 0.2–1.2)
Total Protein: 7.1 g/dL (ref 6.0–8.3)

## 2015-01-23 LAB — T-HELPER CELL (CD4) - (RCID CLINIC ONLY)
CD4 % Helper T Cell: 11 % — ABNORMAL LOW (ref 33–55)
CD4 T Cell Abs: 370 /uL — ABNORMAL LOW (ref 400–2700)

## 2015-01-23 LAB — RPR

## 2015-01-23 LAB — HIV-1 RNA QUANT-NO REFLEX-BLD
HIV 1 RNA QUANT: 45 {copies}/mL — AB (ref <20)
HIV-1 RNA Quant, Log: 1.65 {Log} — ABNORMAL HIGH (ref <1.30)

## 2015-02-05 ENCOUNTER — Encounter: Payer: Self-pay | Admitting: Internal Medicine

## 2015-02-05 ENCOUNTER — Ambulatory Visit (INDEPENDENT_AMBULATORY_CARE_PROVIDER_SITE_OTHER): Payer: Federal, State, Local not specified - PPO | Admitting: Internal Medicine

## 2015-02-05 VITALS — BP 124/87 | HR 80 | Temp 97.9°F | Wt 235.8 lb

## 2015-02-05 DIAGNOSIS — Z113 Encounter for screening for infections with a predominantly sexual mode of transmission: Secondary | ICD-10-CM

## 2015-02-05 DIAGNOSIS — Z23 Encounter for immunization: Secondary | ICD-10-CM | POA: Diagnosis not present

## 2015-02-05 DIAGNOSIS — B20 Human immunodeficiency virus [HIV] disease: Secondary | ICD-10-CM

## 2015-02-05 DIAGNOSIS — Z21 Asymptomatic human immunodeficiency virus [HIV] infection status: Secondary | ICD-10-CM | POA: Diagnosis not present

## 2015-02-05 NOTE — Progress Notes (Signed)
Patient ID: Donzetta Starchobert L Bram, male   DOB: 10-Oct-1961, 54 y.o.   MRN: [id number] HPI: Donzetta StarchRobert L Hodgens is a 54 y.o. male who is here for his routine visit.  Allergies: No Known Allergies  Vitals: Temp: 97.9 F (36.6 C) (04/28 1033) Temp Source: Oral (04/28 1033) BP: 124/87 mmHg (04/28 1033) Pulse Rate: 80 (04/28 1033)  Past Medical History: Past Medical History  Diagnosis Date  . HIV infection   . Bipolar 1 disorder 05/13/2011  . Cigarette smoker 05/26/2011    Have asked him to continue consideration of quitting smoking completely.   Marland Kitchen. HIV (human immunodeficiency virus infection) 05/13/2011  . Rash, skin 05/13/2011    Bilateral arm    . Schizo affective schizophrenia 05/13/2011    Social History: History   Social History  . Marital Status: Single    Spouse Name: N/A  . Number of Children: N/A  . Years of Education: N/A   Social History Main Topics  . Smoking status: Current Every Day Smoker -- 1.00 packs/day for 32 years    Types: Cigarettes  . Smokeless tobacco: Never Used     Comment: going to try to quit this year before 2016  . Alcohol Use: 1.2 oz/week    2 Standard drinks or equivalent per week     Comment: a 5th every 2 days, but not every 2 days  . Drug Use: No  . Sexual Activity: No     Comment: declined condoms   Other Topics Concern  . None   Social History Narrative    Previous Regimen:   Current Regimen: RAL + TRV  Labs: HIV 1 RNA QUANT (copies/mL)  Date Value  01/22/2015 45*  07/21/2014 44*  12/25/2013 <20   CD4 T CELL ABS (/uL)  Date Value  01/22/2015 370*  07/21/2014 370*  12/25/2013 280*   HEP B S AB (no units)  Date Value  05/12/2011 NEG   HEPATITIS B SURFACE AG (no units)  Date Value  05/12/2011 NEGATIVE   HCV AB (no units)  Date Value  05/12/2011 NEGATIVE    CrCl: CrCl cannot be calculated (Unknown ideal weight.).  Lipids:    Component Value Date/Time   CHOL 164 01/22/2015 1351   TRIG 84 01/22/2015 1351   HDL 43  01/22/2015 1351   CHOLHDL 3.8 01/22/2015 1351   VLDL 17 01/22/2015 1351   LDLCALC 104* 01/22/2015 1351    Assessment: 54 yo with bipolar who is here for his f/u. He has been doing well on his current regimen. He is one of those that likes to stay with what he got. We talked to him about possibly going to an easier route with Genvoya. He is on Zyprexa so the cobicistat can boost that level up. He said that he wants to do research first. [facility]. If he does decide to change to [employer], reducing his Zyprexa to 20mg  Qhs is prob the appropriate action at that point.   Recommendations: Cont current regimen of RAL/TRV Possible change to Genvoya in the future  Clide CliffPham, Livan Hires Quang, PharmD Clinical Infectious Disease Pharmacist [facility] 02/05/2015, 11:05 AM

## 2015-02-05 NOTE — Progress Notes (Signed)
Patient ID: Randall Garrett, male   DOB: 1961-08-21, 54 y.o.   MRN: [id number]          Patient Active Problem List   Diagnosis Date Noted  . HIV (human immunodeficiency virus infection) 05/13/2011    Priority: High  . Schizo affective schizophrenia 05/13/2011    Priority: High  . Dyslipidemia 01/08/2014  . Cigarette smoker 05/26/2011  . Bipolar 1 disorder 05/13/2011  . Rash, skin 05/13/2011    Patient's Medications  New Prescriptions   No medications on file  Previous Medications   EMTRICITABINE-TENOFOVIR (TRUVADA) 200-300 MG PER TABLET    Take 1 tablet by mouth daily.   OLANZAPINE (ZYPREXA) 20 MG TABLET    Take 2 tablets (40 mg total) by mouth at bedtime.   RALTEGRAVIR (ISENTRESS) 400 MG TABLET    TAKE 1 TABLET BY MOUTH 2 TIMES DAILY.   TRIAMCINOLONE CREAM (KENALOG) 0.1 %    Apply 1 application topically 2 (two) times daily.  Modified Medications   No medications on file  Discontinued Medications   No medications on file    Subjective: Randall Garrett is in for his routine HIV follow-up visit. He has not missed any doses of his Truvada or Isentress.  He also continues to take his Zyprexa and follows up at [facility] every 3 months. He has been feeling well. He continues to smoke cigarettes and has no current plan to quit. He walks but gets no other regular exercise. He is looking forward to the [employer] coming up in [place].  Review of Systems: Pertinent items are noted in HPI.  Past Medical History  Diagnosis Date  . HIV infection   . Bipolar 1 disorder 05/13/2011  . Cigarette smoker 05/26/2011    Have asked him to continue consideration of quitting smoking completely.   Marland Kitchen HIV (human immunodeficiency virus infection) 05/13/2011  . Rash, skin 05/13/2011    Bilateral arm    . Schizo affective schizophrenia 05/13/2011    History  Substance Use Topics  . Smoking status: Current Every Day Smoker -- 1.00 packs/day for 32 years    Types: Cigarettes  . Smokeless tobacco: Never Used      Comment: going to try to quit this year before 2016  . Alcohol Use: 1.2 oz/week    2 Standard drinks or equivalent per week     Comment: a 5th every 2 days, but not every 2 days    Family History  Problem Relation Age of Onset  . Diabetes Mother   . Hypertension Mother   . Hyperlipidemia Mother   . Stroke Mother   . Diabetes Father   . Hypertension Father   . Hyperlipidemia Father   . Diabetes Sister   . Hypertension Sister   . Hyperlipidemia Sister   . Stroke Sister   . Diabetes Brother   . Hypertension Brother   . Hyperlipidemia Brother   . Cancer Maternal Grandmother     No Known Allergies  Objective: Temp: 97.9 F (36.6 C) (04/28 1033) Temp Source: Oral (04/28 1033) BP: 124/87 mmHg (04/28 1033) Pulse Rate: 80 (04/28 1033) Body mass index is 31.11 kg/(m^2).  General: He is smiling, talkative and in good spirits as usual Oral: No oropharyngeal lesions Skin: No rash Lungs: Clear Cor: Regular S1 and S2 with no murmurs  Lab Results Lab Results  Component Value Date   WBC 7.0 01/22/2015   HGB 15.5 01/22/2015   HCT 44.4 01/22/2015   MCV 86.0 01/22/2015   PLT 235  01/22/2015    Lab Results  Component Value Date   CREATININE 0.88 01/22/2015   BUN 10 01/22/2015   NA 138 01/22/2015   K 4.4 01/22/2015   CL 103 01/22/2015   CO2 25 01/22/2015    Lab Results  Component Value Date   ALT 14 01/22/2015   AST 18 01/22/2015   ALKPHOS 73 01/22/2015   BILITOT 0.4 01/22/2015    Lab Results  Component Value Date   CHOL 164 01/22/2015   HDL 43 01/22/2015   LDLCALC 104* 01/22/2015   TRIG 84 01/22/2015   CHOLHDL 3.8 01/22/2015    Lab Results HIV 1 RNA QUANT (copies/mL)  Date Value  01/22/2015 45*  07/21/2014 44*  12/25/2013 <20   CD4 T CELL ABS (/uL)  Date Value  01/22/2015 370*  07/21/2014 370*  12/25/2013 280*     Assessment: Randall Garrett HIV infection remains under good control. He has low-level viral activation. I talked him about the option of  simplifying his regimen to Genvoya 1 daily. This would require reducing the dose of his Zyprexa. Initially he was interested but then stated he did not want to make a switch now. He wants to do some research on his own. I talked to him at length today about the importance of cigarette cessation given his family history, male sex, and dyslipidemia.  Plan: 1. Continue current antiretroviral regimen 2. Cigarette cessation counseling provided 3. Follow-up after lab work in 6 months 4. Encouraged him to follow-up with his primary care provider and [facility]   Elayna Tobler, MD [facility] [phone] pager   [phone] cell 02/05/2015, 12:03 PM

## 2015-02-06 LAB — URINE CYTOLOGY ANCILLARY ONLY
Chlamydia: NEGATIVE
Neisseria Gonorrhea: NEGATIVE

## 2015-04-28 ENCOUNTER — Other Ambulatory Visit: Payer: Self-pay | Admitting: Internal Medicine

## 2015-05-07 ENCOUNTER — Other Ambulatory Visit: Payer: Self-pay | Admitting: Internal Medicine

## 2015-05-07 DIAGNOSIS — Z0000 Encounter for general adult medical examination without abnormal findings: Secondary | ICD-10-CM

## 2015-05-12 ENCOUNTER — Other Ambulatory Visit (INDEPENDENT_AMBULATORY_CARE_PROVIDER_SITE_OTHER): Payer: Federal, State, Local not specified - PPO

## 2015-05-12 ENCOUNTER — Other Ambulatory Visit: Payer: Federal, State, Local not specified - PPO

## 2015-05-12 DIAGNOSIS — Z0000 Encounter for general adult medical examination without abnormal findings: Secondary | ICD-10-CM | POA: Diagnosis not present

## 2015-05-12 DIAGNOSIS — Z125 Encounter for screening for malignant neoplasm of prostate: Secondary | ICD-10-CM

## 2015-05-12 DIAGNOSIS — Z79899 Other long term (current) drug therapy: Secondary | ICD-10-CM

## 2015-05-12 LAB — COMPREHENSIVE METABOLIC PANEL
ALK PHOS: 77 U/L (ref 39–117)
ALT: 13 U/L (ref 0–53)
AST: 22 U/L (ref 0–37)
Albumin: 4.2 g/dL (ref 3.5–5.2)
BUN: 17 mg/dL (ref 6–23)
CHLORIDE: 104 meq/L (ref 96–112)
CO2: 26 mEq/L (ref 19–32)
Calcium: 9 mg/dL (ref 8.4–10.5)
Creatinine, Ser: 1 mg/dL (ref 0.40–1.50)
GFR: 100.39 mL/min (ref >60.00)
Glucose, Bld: 79 mg/dL (ref 70–99)
Potassium: 4.5 mEq/L (ref 3.5–5.1)
Sodium: 137 mEq/L (ref 135–145)
TOTAL PROTEIN: 7.2 g/dL (ref 6.0–8.3)
Total Bilirubin: 0.4 mg/dL (ref 0.2–1.2)

## 2015-05-12 LAB — LIPID PANEL
CHOLESTEROL: 165 mg/dL (ref 0–200)
HDL: 36.8 mg/dL — ABNORMAL LOW (ref >39.00)
LDL CALC: 114 mg/dL — AB (ref 0–99)
NonHDL: 127.84
Total CHOL/HDL Ratio: 4
Triglycerides: 71 mg/dL (ref 0.0–149.0)
VLDL: 14.2 mg/dL (ref 0.0–40.0)

## 2015-05-12 LAB — CBC
HCT: 45.4 % (ref 39.0–52.0)
HEMOGLOBIN: 15.6 g/dL (ref 13.0–17.0)
MCHC: 34.4 g/dL (ref 30.0–36.0)
MCV: 88.7 fl (ref 78.0–100.0)
PLATELETS: 239 10*3/uL (ref 150.0–400.0)
RBC: 5.12 Mil/uL (ref 4.22–5.81)
RDW: 14.8 % (ref 11.5–15.5)
WBC: 10.6 10*3/uL — AB (ref 4.0–10.5)

## 2015-05-12 LAB — HEMOGLOBIN A1C: Hgb A1c MFr Bld: 5.3 % (ref 4.6–6.5)

## 2015-05-12 LAB — PSA: PSA: 0.34 ng/mL (ref 0.10–4.00)

## 2015-05-15 ENCOUNTER — Encounter: Payer: Federal, State, Local not specified - PPO | Admitting: Internal Medicine

## 2015-06-01 ENCOUNTER — Other Ambulatory Visit: Payer: Self-pay | Admitting: Internal Medicine

## 2015-06-01 DIAGNOSIS — B20 Human immunodeficiency virus [HIV] disease: Secondary | ICD-10-CM

## 2015-06-12 ENCOUNTER — Encounter: Payer: Federal, State, Local not specified - PPO | Admitting: Family

## 2015-06-23 ENCOUNTER — Ambulatory Visit (INDEPENDENT_AMBULATORY_CARE_PROVIDER_SITE_OTHER): Payer: Federal, State, Local not specified - PPO | Admitting: Family

## 2015-06-23 ENCOUNTER — Encounter: Payer: Self-pay | Admitting: Family

## 2015-06-23 VITALS — BP 110/68 | HR 68 | Temp 98.6°F | Resp 18 | Ht 73.0 in | Wt 242.0 lb

## 2015-06-23 DIAGNOSIS — Z720 Tobacco use: Secondary | ICD-10-CM

## 2015-06-23 DIAGNOSIS — Z0000 Encounter for general adult medical examination without abnormal findings: Secondary | ICD-10-CM | POA: Diagnosis not present

## 2015-06-23 DIAGNOSIS — F17210 Nicotine dependence, cigarettes, uncomplicated: Secondary | ICD-10-CM

## 2015-06-23 NOTE — Progress Notes (Addendum)
Subjective:    Patient ID: Randall Garrett, male    DOB: 11-Jan-1961, 54 y.o.   MRN: [id number]  Chief Complaint  Patient presents with  . CPE    Not fasting, suggestions on things to help quit smoking    HPI:  Randall Garrett is a 54 y.o. male who presents today for an annual wellness visit.   1) Health Maintenance -   Diet - Averages about 3 meals per day consisting fruits, vegetables, pork, chicken, beef, Silk milk; 3-4 cups of caffeine daily  [facility] - [facility].    2) Preventative Exams / Immunizations:  Dental -- Due for exam   Vision -- Due for exam   Health Maintenance  Topic Date Due  . COLONOSCOPY  01/03/2012  . INFLUENZA VACCINE  06/22/2016 (Originally 05/11/2015)  . TETANUS/TDAP  11/15/2022  . Hepatitis C Screening  Completed  . HIV Screening  Completed    Immunization History  Administered Date(s) Administered  . Hepatitis A 08/04/2009  . Hepatitis B 08/04/2009  . Influenza Split 08/08/2012  . Influenza Whole 05/26/2011  . Influenza,inj,Quad PF,36+ Mos 07/09/2014  . PPD Test 10/06/2009, 05/12/2011  . Pneumococcal Polysaccharide-23 12/30/2008  . Tdap 11/15/2012   No Known Allergies   Outpatient Prescriptions Prior to Visit  Medication Sig Dispense Refill  . ISENTRESS 400 MG tablet TAKE 1 TABLET BY MOUTH 2 TIMES DAILY. 60 tablet 3  . OLANZapine (ZYPREXA) 20 MG tablet Take 2 tablets (40 mg total) by mouth at bedtime. 60 tablet 3  . triamcinolone cream (KENALOG) 0.1 % Apply 1 application topically 2 (two) times daily. 454 g 11  . TRUVADA 200-300 MG per tablet TAKE 1 TABLET BY MOUTH DAILY. 30 tablet 5   No facility-administered medications prior to visit.     Past Medical History  Diagnosis Date  . HIV infection   . Bipolar 1 disorder 05/13/2011  . Cigarette smoker 05/26/2011    Have asked him to continue consideration of quitting smoking completely.   Marland Kitchen HIV (human immunodeficiency virus infection) 05/13/2011  .  Rash, skin 05/13/2011    Bilateral arm    . Schizo affective schizophrenia 05/13/2011     Past Surgical History  Procedure Laterality Date  . Broke left arm  1991     Family History  Problem Relation Age of Onset  . Diabetes Mother   . Hypertension Mother   . Hyperlipidemia Mother   . Stroke Mother   . Diabetes Father   . Hypertension Father   . Hyperlipidemia Father   . Diabetes Sister   . Hypertension Sister   . Hyperlipidemia Sister   . Stroke Sister   . Diabetes Brother   . Hypertension Brother   . Hyperlipidemia Brother   . Cancer Maternal Grandmother      Social History   Social History  . Marital Status: Single    Spouse Name: N/A  . Number of Children: 0  . Years of Education: 16   Occupational History  . Retired     Social History Main Topics  . Smoking status: Current Every Day Smoker -- 1.00 packs/day for 32 years    Types: Cigarettes  . Smokeless tobacco: Never Used     Comment: going to try to quit this year before 2016  . Alcohol Use: No  . Drug Use: No  . Sexual Activity: No     Comment: declined condoms   Other Topics Concern  .  Not on file   Social History Narrative   Fun: Exercise and outdoor activity.    Denies religious beliefs effecting health care.      Review of Systems  Constitutional: Denies fever, chills, fatigue, or significant weight gain/loss. HENT: Head: Denies headache or neck pain Ears: Denies changes in hearing, ringing in ears, earache, drainage Nose: Denies discharge, stuffiness, itching, nosebleed, sinus pain Throat: Denies sore throat, hoarseness, dry mouth, sores, thrush Eyes: Denies loss/changes in vision, pain, redness, blurry/double vision, flashing lights Cardiovascular: Denies chest pain/discomfort, tightness, palpitations, shortness of breath with activity, difficulty lying down, swelling, sudden awakening with shortness of breath Respiratory: Denies shortness of breath, cough, sputum production,  wheezing Gastrointestinal: Denies dysphasia, heartburn, change in appetite, nausea, change in bowel habits, rectal bleeding, constipation, diarrhea, yellow skin or eyes Genitourinary: Denies frequency, urgency, burning/pain, blood in urine, incontinence, change in urinary strength. Musculoskeletal: Denies muscle/joint pain, stiffness, back pain, redness or swelling of joints, trauma Skin: Denies rashes, lumps, itching, dryness, color changes, or hair/nail changes Neurological: Denies dizziness, fainting, seizures, weakness, numbness, tingling, tremor Psychiatric - Denies nervousness, stress, depression or memory loss Endocrine: Denies heat or cold intolerance, sweating, frequent urination, excessive thirst, changes in appetite Hematologic: Denies ease of bruising or bleeding     Objective:    BP 110/68 mmHg  Pulse 68  Temp(Src) 98.6 F (37 C) (Oral)  Resp 18  Ht 6\' 1"  (1.854 m)  Wt 242 lb (109.77 kg)  BMI 31.93 kg/m2  SpO2 95% Nursing note and vital signs reviewed.  Physical Exam  Constitutional: He is oriented to person, place, and time. He appears well-developed and well-nourished.  HENT:  Head: Normocephalic.  Right Ear: Hearing, tympanic membrane, external ear and ear canal normal.  Left Ear: Hearing, tympanic membrane, external ear and ear canal normal.  Nose: Nose normal.  Mouth/Throat: Uvula is midline, oropharynx is clear and moist and mucous membranes are normal.  Eyes: Conjunctivae and EOM are normal. Pupils are equal, round, and reactive to light.  Neck: Neck supple. No JVD present. No tracheal deviation present. No thyromegaly present.  Cardiovascular: Normal rate, regular rhythm, normal heart sounds and intact distal pulses.   Pulmonary/Chest: Effort normal and breath sounds normal.  Mild clubbing of fingers noted.   Abdominal: Soft. Bowel sounds are normal. He exhibits no distension and no mass. There is no tenderness. There is no rebound and no guarding.   Musculoskeletal: Normal range of motion. He exhibits no edema or tenderness.  Lymphadenopathy:    He has no cervical adenopathy.  Neurological: He is alert and oriented to person, place, and time. He has normal reflexes. No cranial nerve deficit. He exhibits normal muscle tone. Coordination normal.  Skin: Skin is warm and dry.  Psychiatric: He has a normal mood and affect. His behavior is normal. Judgment and thought content normal.       Assessment & Plan:   Problem List Items Addressed This Visit      Other   Routine general medical examination at a health care facility - Primary    1) Anticipatory Guidance: Discussed importance of wearing a seatbelt while driving and not texting while driving; changing batteries in smoke detector at least once annually; wearing suntan lotion when outside; eating a balanced and moderate diet; getting physical activity at least 30 minutes per day.  2) Immunizations / Screenings / Labs:  Declined flu shot. All other immunizations are up to date per recommendations. Due for a dental and vision screening which he  will check his insurance and schedule independently. Due for colonoscopy for which a referral has been placed. All other screenings are up to date per recommendations. Obtain CBC, BMET, Lipid profile, A1c, PSA and TSH.   Overall well exam. Patient has risk factors for chronic disease including tobacco and alcohol use. Indicates that he has recently stopped drinking and is interested in quitting smoking. Discussed potential mechanisms with risks and benefits for smoking cessation including patches, gums and medications. Patient will research and return with a decision. Continue other healthy lifestyle choices of exercise and fine tune nutrition to emphasize nutrient dense foods and limit saturated fats. Follow up prevention exam in 1 year and follow up office visit pending lab work.        Relevant Orders   Ambulatory referral to Gastroenterology    CBC   Comprehensive metabolic panel   Lipid panel   TSH   PSA   Hemoglobin A1c

## 2015-06-23 NOTE — Progress Notes (Signed)
Pre visit review using our clinic review tool, if applicable. No additional management support is needed unless otherwise documented below in the visit note. 

## 2015-06-23 NOTE — Assessment & Plan Note (Signed)
1) Anticipatory Guidance: Discussed importance of wearing a seatbelt while driving and not texting while driving; changing batteries in smoke detector at least once annually; wearing suntan lotion when outside; eating a balanced and moderate diet; getting physical activity at least 30 minutes per day.  2) Immunizations / Screenings / Labs:  Declined flu shot. All other immunizations are up to date per recommendations. Due for a dental and vision screening which he will check his insurance and schedule independently. Due for colonoscopy for which a referral has been placed. All other screenings are up to date per recommendations. Obtain CBC, BMET, Lipid profile, A1c, PSA and TSH.   Overall well exam. Patient has risk factors for chronic disease including tobacco and alcohol use. Indicates that he has recently stopped drinking and is interested in quitting smoking. Discussed potential mechanisms with risks and benefits for smoking cessation including patches, gums and medications. Patient will research and return with a decision. Continue other healthy lifestyle choices of exercise and fine tune nutrition to emphasize nutrient dense foods and limit saturated fats. Follow up prevention exam in 1 year and follow up office visit pending lab work.

## 2015-06-23 NOTE — Patient Instructions (Signed)
Thank you for choosing [facility].  Summary/Instructions:  Please stop by the lab on the basement level of the building for your blood work. Your results will be released to MyChart (or called to you) after review, usually within 72 hours after test completion. If any changes need to be made, you will be notified at that same time.  Referrals have been made during this visit. You should expect to hear back from our schedulers in about 7-10 days in regards to establishing an appointment with the specialists we discussed.   If your symptoms worsen or fail to improve, please contact our office for further instruction, or in case of emergency go directly to the emergency room at the closest medical facility.      Health Maintenance A healthy lifestyle and preventative care can promote health and wellness.  Maintain regular health, dental, and eye exams.  Eat a healthy diet. Foods like vegetables, fruits, whole grains, low-fat dairy products, and lean protein foods contain the nutrients you need and are low in calories. Decrease your intake of foods high in solid fats, added sugars, and salt. Get information about a proper diet from your health care provider, if necessary.  Regular physical exercise is one of the most important things you can do for your health. Most adults should get at least 150 minutes of moderate-intensity exercise (any activity that increases your heart rate and causes you to sweat) each week. In addition, most adults need muscle-strengthening exercises on 2 or more days a week.   Maintain a healthy weight. The body mass index (BMI) is a screening tool to identify possible weight problems. It provides an estimate of body fat based on height and weight. Your health care provider can find your BMI and can help you achieve or maintain a healthy weight. For males 20 years and older:  A BMI below 18.5 is considered underweight.  A BMI of 18.5 to 24.9 is normal.  A BMI  of 25 to 29.9 is considered overweight.  A BMI of 30 and above is considered obese.  Maintain normal blood lipids and cholesterol by exercising and minimizing your intake of saturated fat. Eat a balanced diet with plenty of fruits and vegetables. Blood tests for lipids and cholesterol should begin at age 20 and be repeated every 5 years. If your lipid or cholesterol levels are high, you are over age 50, or you are at high risk for heart disease, you may need your cholesterol levels checked more frequently.Ongoing high lipid and cholesterol levels should be treated with medicines if diet and exercise are not working.  If you smoke, find out from your health care provider how to quit. If you do not use tobacco, do not start.  Lung cancer screening is recommended for adults aged 55-80 years who are at high risk for developing lung cancer because of a history of smoking. A yearly low-dose CT scan of the lungs is recommended for people who have at least a 30-pack-year history of smoking and are current smokers or have quit within the past 15 years. A pack year of smoking is smoking an average of 1 pack of cigarettes a day for 1 year (for example, a 30-pack-year history of smoking could mean smoking 1 pack a day for 30 years or 2 packs a day for 15 years). Yearly screening should continue until the smoker has stopped smoking for at least 15 years. Yearly screening should be stopped for people who develop a health problem that   would prevent them from having lung cancer treatment.  If you choose to drink alcohol, do not have more than 2 drinks per day. One drink is considered to be 12 oz (360 mL) of beer, 5 oz (150 mL) of wine, or 1.5 oz (45 mL) of liquor.  Avoid the use of street drugs. Do not share needles with anyone. Ask for help if you need support or instructions about stopping the use of drugs.  High blood pressure causes heart disease and increases the risk of stroke. Blood pressure should be checked  at least every 1-2 years. Ongoing high blood pressure should be treated with medicines if weight loss and exercise are not effective.  If you are 45-79 years old, ask your health care provider if you should take aspirin to prevent heart disease.  Diabetes screening involves taking a blood sample to check your fasting blood sugar level. This should be done once every 3 years after age 45 if you are at a normal weight and without risk factors for diabetes. Testing should be considered at a younger age or be carried out more frequently if you are overweight and have at least 1 risk factor for diabetes.  Colorectal cancer can be detected and often prevented. Most routine colorectal cancer screening begins at the age of 50 and continues through age 75. However, your health care provider may recommend screening at an earlier age if you have risk factors for colon cancer. On a yearly basis, your health care provider may provide home test kits to check for hidden blood in the stool. A small camera at the end of a tube may be used to directly examine the colon (sigmoidoscopy or colonoscopy) to detect the earliest forms of colorectal cancer. Talk to your health care provider about this at age 50 when routine screening begins. A direct exam of the colon should be repeated every 5-10 years through age 75, unless early forms of precancerous polyps or small growths are found.  People who are at an increased risk for hepatitis B should be screened for this virus. You are considered at high risk for hepatitis B if:  You were born in a country where hepatitis B occurs often. Talk with your health care provider about which countries are considered high risk.  Your parents were born in a high-risk country and you have not received a shot to protect against hepatitis B (hepatitis B vaccine).  You have HIV or AIDS.  You use needles to inject street drugs.  You live with, or have sex with, someone who has hepatitis  B.  You are a man who has sex with other men (MSM).  You get hemodialysis treatment.  You take certain medicines for conditions like cancer, organ transplantation, and autoimmune conditions.  Hepatitis C blood testing is recommended for all people born from 1945 through 1965 and any individual with known risk factors for hepatitis C.  Healthy men should no longer receive prostate-specific antigen (PSA) blood tests as part of routine cancer screening. Talk to your health care provider about prostate cancer screening.  Testicular cancer screening is not recommended for adolescents or adult males who have no symptoms. Screening includes self-exam, a health care provider exam, and other screening tests. Consult with your health care provider about any symptoms you have or any concerns you have about testicular cancer.  Practice safe sex. Use condoms and avoid high-risk sexual practices to reduce the spread of sexually transmitted infections (STIs).  You should be screened   for STIs, including gonorrhea and chlamydia if:  You are sexually active and are younger than 24 years.  You are older than 24 years, and your health care provider tells you that you are at risk for this type of infection.  Your sexual activity has changed since you were last screened, and you are at an increased risk for chlamydia or gonorrhea. Ask your health care provider if you are at risk.  If you are at risk of being infected with HIV, it is recommended that you take a prescription medicine daily to prevent HIV infection. This is called pre-exposure prophylaxis (PrEP). You are considered at risk if:  You are a man who has sex with other men (MSM).  You are a heterosexual man who is sexually active with multiple partners.  You take drugs by injection.  You are sexually active with a partner who has HIV.  Talk with your health care provider about whether you are at high risk of being infected with HIV. If you  choose to begin PrEP, you should first be tested for HIV. You should then be tested every 3 months for as long as you are taking PrEP.  Use sunscreen. Apply sunscreen liberally and repeatedly throughout the day. You should seek shade when your shadow is shorter than you. Protect yourself by wearing long sleeves, pants, a wide-brimmed hat, and sunglasses year round whenever you are outdoors.  Tell your health care provider of new moles or changes in moles, especially if there is a change in shape or color. Also, tell your health care provider if a mole is larger than the size of a pencil eraser.  A one-time screening for abdominal aortic aneurysm (AAA) and surgical repair of large AAAs by ultrasound is recommended for men aged 65-75 years who are current or former smokers.  Stay current with your vaccines (immunizations). Document Released: 03/24/2008 Document Revised: 10/01/2013 Document Reviewed: 02/21/2011 ExitCare Patient Information 2015 [employer]. This information is not intended to replace advice given to you by your health care provider. Make sure you discuss any questions you have with your health care provider.   Smoking Cessation Quitting smoking is important to your health and has many advantages. However, it is not always easy to quit since nicotine is a very addictive drug. Oftentimes, people try 3 times or more before being able to quit. This document explains the best ways for you to prepare to quit smoking. Quitting takes hard work and a lot of effort, but you can do it. ADVANTAGES OF QUITTING SMOKING  You will live longer, feel better, and live better.  Your body will feel the impact of quitting smoking almost immediately.  Within 20 minutes, blood pressure decreases. Your pulse returns to its normal level.  After 8 hours, carbon monoxide levels in the blood return to normal. Your oxygen level increases.  After 24 hours, the chance of having a heart attack starts to  decrease. Your breath, hair, and body stop smelling like smoke.  After 48 hours, damaged nerve endings begin to recover. Your sense of taste and smell improve.  After 72 hours, the body is virtually free of nicotine. Your bronchial tubes relax and breathing becomes easier.  After 2 to 12 weeks, lungs can hold more air. Exercise becomes easier and circulation improves.  The risk of having a heart attack, stroke, cancer, or lung disease is greatly reduced.  After 1 year, the risk of coronary heart disease is cut in half.  After 5   years, the risk of stroke falls to the same as a nonsmoker.  After 10 years, the risk of lung cancer is cut in half and the risk of other cancers decreases significantly.  After 15 years, the risk of coronary heart disease drops, usually to the level of a nonsmoker.  If you are pregnant, quitting smoking will improve your chances of having a healthy baby.  The people you live with, especially any children, will be healthier.  You will have extra money to spend on things other than cigarettes. QUESTIONS TO THINK ABOUT BEFORE ATTEMPTING TO QUIT You may want to talk about your answers with your health care provider.  Why do you want to quit?  If you tried to quit in the past, what helped and what did not?  What will be the most difficult situations for you after you quit? How will you plan to handle them?  Who can help you through the tough times? Your family? Friends? A health care provider?  What pleasures do you get from smoking? What ways can you still get pleasure if you quit? Here are some questions to ask your health care provider:  How can you help me to be successful at quitting?  What medicine do you think would be best for me and how should I take it?  What should I do if I need more help?  What is smoking withdrawal like? How can I get information on withdrawal? GET READY  Set a quit date.  Change your environment by getting rid of all  cigarettes, ashtrays, matches, and lighters in your home, car, or work. Do not let people smoke in your home.  Review your past attempts to quit. Think about what worked and what did not. GET SUPPORT AND ENCOURAGEMENT You have a better chance of being successful if you have help. You can get support in many ways.  Tell your family, friends, and coworkers that you are going to quit and need their support. Ask them not to smoke around you.  Get individual, group, or telephone counseling and support. Programs are available at local hospitals and health centers. Call your local health department for information about programs in your area.  Spiritual beliefs and practices may help some smokers quit.  Download a "quit meter" on your computer to keep track of quit statistics, such as how long you have gone without smoking, cigarettes not smoked, and money saved.  Get a self-help book about quitting smoking and staying off tobacco. LEARN NEW SKILLS AND BEHAVIORS  Distract yourself from urges to smoke. Talk to someone, go for a walk, or occupy your time with a task.  Change your normal routine. Take a different route to work. Drink tea instead of coffee. Eat breakfast in a different place.  Reduce your stress. Take a hot bath, exercise, or read a book.  Plan something enjoyable to do every day. Reward yourself for not smoking.  Explore interactive web-based programs that specialize in helping you quit. GET MEDICINE AND USE IT CORRECTLY Medicines can help you stop smoking and decrease the urge to smoke. Combining medicine with the above behavioral methods and support can greatly increase your chances of successfully quitting smoking.  Nicotine replacement therapy helps deliver nicotine to your body without the negative effects and risks of smoking. Nicotine replacement therapy includes nicotine gum, lozenges, inhalers, nasal sprays, and skin patches. Some may be available over-the-counter and  others require a prescription.  Antidepressant medicine helps people abstain from smoking,   but how this works is unknown. This medicine is available by prescription.  Nicotinic receptor partial agonist medicine simulates the effect of nicotine in your brain. This medicine is available by prescription. Ask your health care provider for advice about which medicines to use and how to use them based on your health history. Your health care provider will tell you what side effects to look out for if you choose to be on a medicine or therapy. Carefully read the information on the package. Do not use any other product containing nicotine while using a nicotine replacement product.  RELAPSE OR DIFFICULT SITUATIONS Most relapses occur within the first 3 months after quitting. Do not be discouraged if you start smoking again. Remember, most people try several times before finally quitting. You may have symptoms of withdrawal because your body is used to nicotine. You may crave cigarettes, be irritable, feel very hungry, cough often, get headaches, or have difficulty concentrating. The withdrawal symptoms are only temporary. They are strongest when you first quit, but they will go away within 10-14 days. To reduce the chances of relapse, try to:  Avoid drinking alcohol. Drinking lowers your chances of successfully quitting.  Reduce the amount of caffeine you consume. Once you quit smoking, the amount of caffeine in your body increases and can give you symptoms, such as a rapid heartbeat, sweating, and anxiety.  Avoid smokers because they can make you want to smoke.  Do not let weight gain distract you. Many smokers will gain weight when they quit, usually less than 10 pounds. Eat a healthy diet and stay active. You can always lose the weight gained after you quit.  Find ways to improve your mood other than smoking. FOR MORE INFORMATION  www.smokefree.gov  Document Released: 09/20/2001 Document Revised:  02/10/2014 Document Reviewed: 01/05/2012 ExitCare Patient Information 2015 [employer]. This information is not intended to replace advice given to you by your health care provider. Make sure you discuss any questions you have with your health care provider.  Varenicline oral tablets What is this medicine? VARENICLINE (var EN i kleen) is used to help people quit smoking. It can reduce the symptoms caused by stopping smoking. It is used with a patient support program recommended by your physician. This medicine may be used for other purposes; ask your health care provider or pharmacist if you have questions. COMMON BRAND NAME(S): Chantix What should I tell my health care provider before I take this medicine? They need to know if you have any of these conditions: -bipolar disorder, depression, schizophrenia or other mental illness -heart disease -if you often drink alcohol -kidney disease -peripheral vascular disease -seizures -stroke -suicidal thoughts, plans, or attempt; a previous suicide attempt by you or a family member -an unusual or allergic reaction to varenicline, other medicines, foods, dyes, or preservatives -pregnant or trying to get pregnant -breast-feeding How should I use this medicine? You should set a date to stop smoking and tell your doctor. Start this medicine one week before the quit date. You can also start taking this medicine before you choose a quit date, and then pick a quit date that is between 8 and 35 days of treatment with this medicine. Stick to your plan; ask about support groups or other ways to help you remain a 'quitter'. Take this medicine by mouth after eating. Take with a full glass of water. Follow the directions on the prescription label. Take your doses at regular intervals. Do not take your medicine more often than   directed. A special MedGuide will be given to you by the pharmacist with each prescription and refill. Be sure to read this information  carefully each time. Talk to your pediatrician regarding the use of this medicine in children. This medicine is not approved for use in children. Overdosage: If you think you have taken too much of this medicine contact a poison control center or emergency room at once. NOTE: This medicine is only for you. Do not share this medicine with others. What if I miss a dose? If you miss a dose, take it as soon as you can. If it is almost time for your next dose, take only that dose. Do not take double or extra doses. What may interact with this medicine? -alcohol or any product that contains alcohol -insulin -other stop smoking aids -theophylline -warfarin This list may not describe all possible interactions. Give your health care provider a list of all the medicines, herbs, non-prescription drugs, or dietary supplements you use. Also tell them if you smoke, drink alcohol, or use illegal drugs. Some items may interact with your medicine. What should I watch for while using this medicine? Visit your doctor or health care professional for regular check ups. Ask for ongoing advice and encouragement from your doctor or healthcare professional, friends, and family to help you quit. If you smoke while on this medication, quit again Your mouth may get dry. Chewing sugarless gum or sucking hard candy, and drinking plenty of water may help. Contact your doctor if the problem does not go away or is severe. You may get drowsy or dizzy. Do not drive, use machinery, or do anything that needs mental alertness until you know how this medicine affects you. Do not stand or sit up quickly, especially if you are an older patient. This reduces the risk of dizzy or fainting spells. The use of this medicine may increase the chance of suicidal thoughts or actions. Pay special attention to how you are responding while on this medicine. Any worsening of mood, or thoughts of suicide or dying should be reported to your health care  professional right away. What side effects may I notice from receiving this medicine? Side effects that you should report to your doctor or health care professional as soon as possible: -allergic reactions like skin rash, itching or hives, swelling of the face, lips, tongue, or throat -breathing problems -changes in vision -chest pain or chest tightness -confusion, trouble speaking or understanding -fast, irregular heartbeat -feeling faint or lightheaded, falls -fever -pain in legs when walking -problems with balance, talking, walking -redness, blistering, peeling or loosening of the skin, including inside the mouth -ringing in ears -seizures -sudden numbness or weakness of the face, arm or leg -suicidal thoughts or other mood changes -trouble passing urine or change in the amount of urine -unusual bleeding or bruising -unusually weak or tired Side effects that usually do not require medical attention (report to your doctor or health care professional if they continue or are bothersome): -constipation -headache -nausea, vomiting -strange dreams -stomach gas -trouble sleeping This list may not describe all possible side effects. Call your doctor for medical advice about side effects. You may report side effects to FDA at 1-800-FDA-1088. Where should I keep my medicine? Keep out of the reach of children. Store at room temperature between 15 and 30 degrees C (59 and 86 degrees F). Throw away any unused medicine after the expiration date. NOTE: This sheet is a summary. It may not cover all possible   information. If you have questions about this medicine, talk to your doctor, pharmacist, or health care provider.  2015, [employer] Standard. (2013-07-08 13:37:47) Bupropion sustained-release tablets (smoking cessation) What is this medicine? BUPROPION (byoo PROE pee on) is used to help people quit smoking. This medicine may be used for other purposes; ask your health care provider or  pharmacist if you have questions. COMMON BRAND NAME(S): Buproban, Zyban What should I tell my health care provider before I take this medicine? They need to know if you have any of these conditions: -an eating disorder, such as anorexia or bulimia -bipolar disorder or psychosis -diabetes or high blood sugar, treated with medication -glaucoma -head injury or brain tumor -heart disease, previous heart attack, or irregular heart beat -high blood pressure -kidney or liver disease -seizures -suicidal thoughts or a previous suicide attempt -Tourette's syndrome -weight loss -an unusual or allergic reaction to bupropion, other medicines, foods, dyes, or preservatives -breast-feeding -pregnant or trying to become pregnant How should I use this medicine? Take this medicine by mouth with a glass of water. Follow the directions on the prescription label. You can take it with or without food. If it upsets your stomach, take it with food. Do not cut, crush or chew this medicine. Take your medicine at regular intervals. If you take this medicine more than once a day, take your second dose at least 8 hours after you take your first dose. To limit difficulty in sleeping, avoid taking this medicine at bedtime. Do not take your medicine more often than directed. Do not stop taking this medicine suddenly except upon the advice of your doctor. Stopping this medicine too quickly may cause serious side effects. A special MedGuide will be given to you by the pharmacist with each prescription and refill. Be sure to read this information carefully each time. Talk to your pediatrician regarding the use of this medicine in children. Special care may be needed. Overdosage: If you think you have taken too much of this medicine contact a poison control center or emergency room at once. NOTE: This medicine is only for you. Do not share this medicine with others. What if I miss a dose? If you miss a dose, skip the missed  dose and take your next tablet at the regular time. There should be at least 8 hours between doses. Do not take double or extra doses. What may interact with this medicine? Do not take this medicine with any of the following medications: -linezolid -MAOIs like Azilect, Carbex, Eldepryl, Marplan, Nardil, and Parnate -methylene blue (injected into a vein) -other medicines that contain bupropion like Wellbutrin This medicine may also interact with the following medications: -alcohol -certain medicines for anxiety or sleep -certain medicines for blood pressure like metoprolol, propranolol -certain medicines for depression or psychotic disturbances -certain medicines for HIV or AIDS like efavirenz, lopinavir, nelfinavir, ritonavir -certain medicines for irregular heart beat like propafenone, flecainide -certain medicines for Parkinson's disease like amantadine, levodopa -certain medicines for seizures like carbamazepine, phenytoin, phenobarbital -cimetidine -clopidogrel -cyclophosphamide -furazolidone -isoniazid -nicotine -orphenadrine -procarbazine -steroid medicines like prednisone or cortisone -stimulant medicines for attention disorders, weight loss, or to stay awake -tamoxifen -theophylline -thiotepa -ticlopidine -tramadol -warfarin This list may not describe all possible interactions. Give your health care provider a list of all the medicines, herbs, non-prescription drugs, or dietary supplements you use. Also tell them if you smoke, drink alcohol, or use illegal drugs. Some items may interact with your medicine. What should I watch for while using this   medicine? Visit your doctor or health care professional for regular checks on your progress. This medicine should be used together with a patient support program. It is important to participate in a behavioral program, counseling, or other support program that is recommended by your health care professional. Patients and their  families should watch out for new or worsening thoughts of suicide or depression. Also watch out for sudden changes in feelings such as feeling anxious, agitated, panicky, irritable, hostile, aggressive, impulsive, severely restless, overly excited and hyperactive, or not being able to sleep. If this happens, especially at the beginning of treatment or after a change in dose, call your health care professional. Avoid alcoholic drinks while taking this medicine. Drinking excessive alcoholic beverages, using sleeping or anxiety medicines, or quickly stopping the use of these agents while taking this medicine may increase your risk for a seizure. Do not drive or use heavy machinery until you know how this medicine affects you. This medicine can impair your ability to perform these tasks. Do not take this medicine close to bedtime. It may prevent you from sleeping. Your mouth may get dry. Chewing sugarless gum or sucking hard candy, and drinking plenty of water may help. Contact your doctor if the problem does not go away or is severe. Do not use nicotine patches or chewing gum without the advice of your doctor or health care professional while taking this medicine. You may need to have your blood pressure taken regularly if your doctor recommends that you use both nicotine and this medicine together. What side effects may I notice from receiving this medicine? Side effects that you should report to your doctor or health care professional as soon as possible: -allergic reactions like skin rash, itching or hives, swelling of the face, lips, or tongue -breathing problems -changes in vision -confusion -fast or irregular heartbeat -hallucinations -increased blood pressure -redness, blistering, peeling or loosening of the skin, including inside the mouth -seizures -suicidal thoughts or other mood changes -unusually weak or tired -vomiting Side effects that usually do not require medical attention (report  to your doctor or health care professional if they continue or are bothersome): -change in sex drive or performance -constipation -headache -loss of appetite -nausea -tremors -weight loss This list may not describe all possible side effects. Call your doctor for medical advice about side effects. You may report side effects to FDA at 1-800-FDA-1088. Where should I keep my medicine? Keep out of the reach of children. Store at room temperature between 20 and 25 degrees C (68 and 77 degrees F). Protect from light. Keep container tightly closed. Throw away any unused medicine after the expiration date. NOTE: This sheet is a summary. It may not cover all possible information. If you have questions about this medicine, talk to your doctor, pharmacist, or health care provider.  2015, [employer]vier/[employer] Standard. (2013-05-24 10:55:10)  

## 2015-06-24 ENCOUNTER — Encounter: Payer: Self-pay | Admitting: Internal Medicine

## 2015-06-24 ENCOUNTER — Other Ambulatory Visit (INDEPENDENT_AMBULATORY_CARE_PROVIDER_SITE_OTHER): Payer: Federal, State, Local not specified - PPO

## 2015-06-24 DIAGNOSIS — Z0000 Encounter for general adult medical examination without abnormal findings: Secondary | ICD-10-CM | POA: Diagnosis not present

## 2015-06-24 LAB — COMPREHENSIVE METABOLIC PANEL
ALT: 14 U/L (ref 0–53)
AST: 17 U/L (ref 0–37)
Albumin: 4 g/dL (ref 3.5–5.2)
Alkaline Phosphatase: 83 U/L (ref 39–117)
BUN: 18 mg/dL (ref 6–23)
CALCIUM: 9.2 mg/dL (ref 8.4–10.5)
CHLORIDE: 103 meq/L (ref 96–112)
CO2: 25 meq/L (ref 19–32)
CREATININE: 0.98 mg/dL (ref 0.40–1.50)
GFR: 102.71 mL/min (ref >60.00)
Glucose, Bld: 90 mg/dL (ref 70–99)
POTASSIUM: 4.8 meq/L (ref 3.5–5.1)
SODIUM: 137 meq/L (ref 135–145)
Total Bilirubin: 0.5 mg/dL (ref 0.2–1.2)
Total Protein: 7.2 g/dL (ref 6.0–8.3)

## 2015-06-24 LAB — LIPID PANEL
CHOL/HDL RATIO: 5
Cholesterol: 167 mg/dL (ref 0–200)
HDL: 35.3 mg/dL — ABNORMAL LOW (ref >39.00)
LDL CALC: 115 mg/dL — AB (ref 0–99)
NonHDL: 132.01
TRIGLYCERIDES: 87 mg/dL (ref 0.0–149.0)
VLDL: 17.4 mg/dL (ref 0.0–40.0)

## 2015-06-24 LAB — CBC
HCT: 46.8 % (ref 39.0–52.0)
Hemoglobin: 15.9 g/dL (ref 13.0–17.0)
MCHC: 34 g/dL (ref 30.0–36.0)
MCV: 89 fl (ref 78.0–100.0)
PLATELETS: 203 10*3/uL (ref 150.0–400.0)
RBC: 5.26 Mil/uL (ref 4.22–5.81)
RDW: 15.2 % (ref 11.5–15.5)
WBC: 6.5 10*3/uL (ref 4.0–10.5)

## 2015-06-24 LAB — TSH: TSH: 0.12 u[IU]/mL — ABNORMAL LOW (ref 0.35–4.50)

## 2015-06-24 LAB — HEMOGLOBIN A1C: Hgb A1c MFr Bld: 5.4 % (ref 4.6–6.5)

## 2015-06-24 LAB — PSA: PSA: 0.33 ng/mL (ref 0.10–4.00)

## 2015-06-25 ENCOUNTER — Telehealth: Payer: Self-pay | Admitting: Family

## 2015-06-25 NOTE — Telephone Encounter (Signed)
Please inform patient that his kidney function, electrolytes, liver function, thyroid function, prostate,white/red blood cells and A1c (diabetes) are all within the normal limits.  His thyroid was slightly decreased indicating increased function of his thyroid. I would like for him to come and check his thyroid again in about 2 weeks to confirm that it is indeed low. Otherwise his cholesterol shows that his HDL is low which increases his risk for cardiovascular disease and his LDL or bad cholesterol is high which also increases his risk for cardiovascular disease. Current guidelines show that his risk for heart related event to be 11% which therefore is recommended for him to start on cholesterol medication if he is willing. This combined with diet and exercise should reduce his overall risk. If in agreement please start pravstatin 20 mg daily.

## 2015-06-29 NOTE — Telephone Encounter (Signed)
Pt aware of results. Wants to think about the cholesterol medication. States that he will give [place] an answer when he comes back to get TSH done.

## 2015-06-29 NOTE — Telephone Encounter (Signed)
Noted! Thank you

## 2015-08-22 ENCOUNTER — Other Ambulatory Visit: Payer: Self-pay | Admitting: Internal Medicine

## 2015-08-26 ENCOUNTER — Encounter: Payer: Federal, State, Local not specified - PPO | Admitting: Internal Medicine

## 2015-10-21 ENCOUNTER — Other Ambulatory Visit: Payer: Self-pay | Admitting: *Deleted

## 2015-10-21 DIAGNOSIS — B20 Human immunodeficiency virus [HIV] disease: Secondary | ICD-10-CM

## 2015-10-21 MED ORDER — EMTRICITABINE-TENOFOVIR DF 200-300 MG PO TABS: 1.0000 | ORAL_TABLET | Freq: Every day | ORAL | Status: DC

## 2015-10-21 MED ORDER — RALTEGRAVIR POTASSIUM 400 MG PO TABS: ORAL_TABLET | ORAL | Status: DC

## 2015-10-27 ENCOUNTER — Ambulatory Visit (INDEPENDENT_AMBULATORY_CARE_PROVIDER_SITE_OTHER): Payer: Federal, State, Local not specified - PPO | Admitting: Internal Medicine

## 2015-10-27 ENCOUNTER — Encounter: Payer: Self-pay | Admitting: Internal Medicine

## 2015-10-27 VITALS — BP 121/75 | HR 68 | Temp 97.7°F | Wt 250.0 lb

## 2015-10-27 DIAGNOSIS — B20 Human immunodeficiency virus [HIV] disease: Secondary | ICD-10-CM

## 2015-10-27 DIAGNOSIS — Z21 Asymptomatic human immunodeficiency virus [HIV] infection status: Secondary | ICD-10-CM | POA: Diagnosis not present

## 2015-10-27 DIAGNOSIS — Z23 Encounter for immunization: Secondary | ICD-10-CM | POA: Diagnosis not present

## 2015-10-27 LAB — LIPID PANEL
CHOL/HDL RATIO: 4.5 ratio (ref <=5.0)
CHOLESTEROL: 147 mg/dL (ref 125–200)
HDL: 33 mg/dL — AB (ref >=40)
LDL Cholesterol: 82 mg/dL (ref <130)
TRIGLYCERIDES: 158 mg/dL — AB (ref <150)
VLDL: 32 mg/dL — AB (ref <30)

## 2015-10-27 LAB — CBC
HEMATOCRIT: 43.5 % (ref 39.0–52.0)
HEMOGLOBIN: 15.4 g/dL (ref 13.0–17.0)
MCH: 30.2 pg (ref 26.0–34.0)
MCHC: 35.4 g/dL (ref 30.0–36.0)
MCV: 85.3 fL (ref 78.0–100.0)
MPV: 9.9 fL (ref 8.6–12.4)
Platelets: 258 10*3/uL (ref 150–400)
RBC: 5.1 MIL/uL (ref 4.22–5.81)
RDW: 14.6 % (ref 11.5–15.5)
WBC: 9.8 10*3/uL (ref 4.0–10.5)

## 2015-10-27 LAB — COMPREHENSIVE METABOLIC PANEL
ALBUMIN: 3.7 g/dL (ref 3.6–5.1)
ALT: 11 U/L (ref 9–46)
AST: 15 U/L (ref 10–35)
Alkaline Phosphatase: 65 U/L (ref 40–115)
BUN: 15 mg/dL (ref 7–25)
CALCIUM: 8.7 mg/dL (ref 8.6–10.3)
CHLORIDE: 103 mmol/L (ref 98–110)
CO2: 23 mmol/L (ref 20–31)
Creat: 1.02 mg/dL (ref 0.70–1.33)
GLUCOSE: 83 mg/dL (ref 65–99)
Potassium: 4.7 mmol/L (ref 3.5–5.3)
Sodium: 138 mmol/L (ref 135–146)
Total Bilirubin: 0.4 mg/dL (ref 0.2–1.2)
Total Protein: 6.6 g/dL (ref 6.1–8.1)

## 2015-10-27 MED ORDER — EMTRICITABINE-TENOFOVIR AF 200-25 MG PO TABS: 1.0000 | ORAL_TABLET | Freq: Every day | ORAL | Status: DC

## 2015-10-27 MED ORDER — DOLUTEGRAVIR SODIUM 50 MG PO TABS: 50.0000 mg | ORAL_TABLET | Freq: Every day | ORAL | Status: DC

## 2015-10-27 NOTE — Progress Notes (Signed)
Patient Active Problem List   Diagnosis Date Noted  . HIV (human immunodeficiency virus infection) (HCC) 05/13/2011    Priority: High  . Schizo affective schizophrenia (HCC) 05/13/2011    Priority: High  . Routine general medical examination at a health care facility 06/23/2015  . Dyslipidemia 01/08/2014  . Cigarette smoker 05/26/2011  . Bipolar 1 disorder (HCC) 05/13/2011  . Rash, skin 05/13/2011    Patient's Medications  New Prescriptions   DOLUTEGRAVIR (TIVICAY) 50 MG TABLET    Take 1 tablet (50 mg total) by mouth daily.   EMTRICITABINE-TENOFOVIR AF (DESCOVY) 200-25 MG TABLET    Take 1 tablet by mouth daily.  Previous Medications   OLANZAPINE (ZYPREXA) 20 MG TABLET    Take 2 tablets (40 mg total) by mouth at bedtime.   TRIAMCINOLONE CREAM (KENALOG) 0.1 %    Apply 1 application topically 2 (two) times daily.  Modified Medications   No medications on file  Discontinued Medications   EMTRICITABINE-TENOFOVIR (TRUVADA) 200-300 MG TABLET    Take 1 tablet by mouth daily.   RALTEGRAVIR (ISENTRESS) 400 MG TABLET    TAKE 1 TABLET BY MOUTH 2 TIMES DAILY.    Subjective: Randall Garrett is in for his routine HIV follow-up visit he states that he is doing well. He never misses a single dose of his Truvada or Isentress. He has no problems tolerating them. He takes one dose of Truvada and Isentress in the morning. He takes his second dose of Isentress with his Zyprexa at night. He goes to [place] every 3 months for mental health counseling. He is planning a trip to [place] this coming summer.   Review of Systems: Review of Systems  Constitutional: Negative for fever, chills, weight loss, malaise/fatigue and diaphoresis.  HENT: Negative for sore throat.   Respiratory: Negative for cough, sputum production and shortness of breath.   Cardiovascular: Negative for chest pain.  Gastrointestinal: Negative for nausea, vomiting and diarrhea.  Genitourinary: Negative for dysuria.    Musculoskeletal: Negative for myalgias and joint pain.  Skin: Negative for rash.  Neurological: Negative for headaches.  Psychiatric/Behavioral: Negative for depression and substance abuse. The patient is not nervous/anxious.     Past Medical History  Diagnosis Date  . HIV infection (HCC)   . Bipolar 1 disorder (HCC) 05/13/2011  . Cigarette smoker 05/26/2011    Have asked him to continue consideration of quitting smoking completely.   Marland Kitchen HIV (human immunodeficiency virus infection) (HCC) 05/13/2011  . Rash, skin 05/13/2011    Bilateral arm    . Schizo affective schizophrenia (HCC) 05/13/2011    Social History  Substance Use Topics  . Smoking status: Current Every Day Smoker -- 1.00 packs/day for 32 years    Types: Cigarettes  . Smokeless tobacco: Never Used     Comment: going to try to quit this year before 2016  . Alcohol Use: No    Family History  Problem Relation Age of Onset  . Diabetes Mother   . Hypertension Mother   . Hyperlipidemia Mother   . Stroke Mother   . Diabetes Father   . Hypertension Father   . Hyperlipidemia Father   . Diabetes Sister   . Hypertension Sister   . Hyperlipidemia Sister   . Stroke Sister   . Diabetes Brother   . Hypertension Brother   . Hyperlipidemia Brother   . Cancer Maternal Grandmother     No Known Allergies  Objective:  Filed Vitals:  10/27/15 1030  BP: 121/75  Pulse: 68  Temp: 97.7 F (36.5 C)  TempSrc: Oral  Weight: 250 lb (113.399 kg)   Body mass index is 32.99 kg/(m^2).  Physical Exam  Constitutional: He is oriented to person, place, and time. No distress.  He is in good spirits as usual.  HENT:  Mouth/Throat: No oropharyngeal exudate.  Eyes: Conjunctivae are normal.  Cardiovascular: Normal rate and regular rhythm.   No murmur heard. Pulmonary/Chest: Breath sounds normal.  Abdominal: Soft. He exhibits no mass. There is no tenderness.  Neurological: He is alert and oriented to person, place, and time.  Skin: No  rash noted.  Psychiatric: Mood and affect normal.    Lab Results Lab Results  Component Value Date   WBC 6.5 06/24/2015   HGB 15.9 06/24/2015   HCT 46.8 06/24/2015   MCV 89.0 06/24/2015   PLT 203.0 06/24/2015    Lab Results  Component Value Date   CREATININE 0.98 06/24/2015   BUN 18 06/24/2015   NA 137 06/24/2015   K 4.8 06/24/2015   CL 103 06/24/2015   CO2 25 06/24/2015    Lab Results  Component Value Date   ALT 14 06/24/2015   AST 17 06/24/2015   ALKPHOS 83 06/24/2015   BILITOT 0.5 06/24/2015    Lab Results  Component Value Date   CHOL 167 06/24/2015   HDL 35.30* 06/24/2015   LDLCALC 115* 06/24/2015   TRIG 87.0 06/24/2015   CHOLHDL 5 06/24/2015    Lab Results HIV 1 RNA QUANT (copies/mL)  Date Value  01/22/2015 45*  07/21/2014 44*  12/25/2013 <20   CD4 T CELL ABS (/uL)  Date Value  01/22/2015 370*  07/21/2014 370*  12/25/2013 280*      Problem List Items Addressed This Visit      High   HIV (human immunodeficiency virus infection) (HCC)    [name]'s infection remains under very good control. I talked to him again about ways to simplify and improve his antiretroviral regimen. He does not eat meals on a regular basis so Genvoya is not a great option. He wants to continue the same class of agents he is on. I will change him to Descovy and Tivicay. He will get repeat lab work today. He will follow-up in 6 months.      Relevant Medications   emtricitabine-tenofovir AF (DESCOVY) 200-25 MG tablet   dolutegravir (TIVICAY) 50 MG tablet   Other Relevant Orders   T-helper cell (CD4)- ([facility] clinic only)   HIV 1 RNA quant-no reflex-bld   CBC   Comprehensive metabolic panel   RPR   Lipid panel    Other Visit Diagnoses    Need for prophylactic vaccination and inoculation against influenza    -  Primary    Relevant Orders    Flu Vaccine QUAD 36+ mos IM (Fluarix & Fluzone Quad PF         Cliffton Asters, MD [facility] for Infectious Disease [facility] [phone] pager   [phone] cell 10/27/2015, 11:11 AM

## 2015-10-27 NOTE — Assessment & Plan Note (Signed)
Randall Garrett's infection remains under very good control. I talked to him again about ways to simplify and improve his antiretroviral regimen. He does not eat meals on a regular basis so Genvoya is not a great option. He wants to continue the same class of agents he is on. I will change him to Descovy and Tivicay. He will get repeat lab work today. He will follow-up in 6 months.

## 2015-10-28 LAB — T-HELPER CELL (CD4) - (RCID CLINIC ONLY)
CD4 % Helper T Cell: 11 % — ABNORMAL LOW (ref 33–55)
CD4 T Cell Abs: 390 /uL — ABNORMAL LOW (ref 400–2700)

## 2015-10-28 LAB — HIV-1 RNA QUANT-NO REFLEX-BLD
HIV 1 RNA Quant: <20 copies/mL (ref <20)
HIV-1 RNA Quant, Log: <1.3 Log copies/mL (ref <1.30)

## 2015-10-28 LAB — RPR

## 2016-01-26 ENCOUNTER — Ambulatory Visit: Payer: Federal, State, Local not specified - PPO | Admitting: Internal Medicine

## 2016-10-09 ENCOUNTER — Other Ambulatory Visit: Payer: Self-pay | Admitting: Internal Medicine

## 2016-10-09 DIAGNOSIS — B20 Human immunodeficiency virus [HIV] disease: Secondary | ICD-10-CM

## 2016-10-11 ENCOUNTER — Other Ambulatory Visit: Payer: Self-pay | Admitting: Internal Medicine

## 2016-10-11 ENCOUNTER — Other Ambulatory Visit: Payer: Self-pay | Admitting: *Deleted

## 2016-10-11 DIAGNOSIS — B20 Human immunodeficiency virus [HIV] disease: Secondary | ICD-10-CM

## 2016-10-11 MED ORDER — EMTRICITABINE-TENOFOVIR AF 200-25 MG PO TABS: 1.0000 | ORAL_TABLET | Freq: Every day | ORAL | 0 refills | Status: DC

## 2016-10-11 MED ORDER — DOLUTEGRAVIR SODIUM 50 MG PO TABS: 50.0000 mg | ORAL_TABLET | Freq: Every day | ORAL | 0 refills | Status: DC

## 2016-10-14 ENCOUNTER — Other Ambulatory Visit: Payer: Federal, State, Local not specified - PPO

## 2016-10-18 ENCOUNTER — Other Ambulatory Visit: Payer: Federal, State, Local not specified - PPO

## 2016-10-18 ENCOUNTER — Other Ambulatory Visit: Payer: Self-pay | Admitting: *Deleted

## 2016-10-18 DIAGNOSIS — B20 Human immunodeficiency virus [HIV] disease: Secondary | ICD-10-CM

## 2016-10-18 DIAGNOSIS — Z113 Encounter for screening for infections with a predominantly sexual mode of transmission: Secondary | ICD-10-CM

## 2016-10-18 LAB — COMPLETE METABOLIC PANEL WITH GFR
ALT: 8 U/L — AB (ref 9–46)
AST: 16 U/L (ref 10–35)
Albumin: 3.8 g/dL (ref 3.6–5.1)
Alkaline Phosphatase: 76 U/L (ref 40–115)
BUN: 13 mg/dL (ref 7–25)
CHLORIDE: 106 mmol/L (ref 98–110)
CO2: 24 mmol/L (ref 20–31)
CREATININE: 1.19 mg/dL (ref 0.70–1.33)
Calcium: 8.7 mg/dL (ref 8.6–10.3)
GFR, Est African American: 80 mL/min (ref >=60)
GFR, Est Non African American: 69 mL/min (ref >=60)
GLUCOSE: 79 mg/dL (ref 65–99)
Potassium: 4.7 mmol/L (ref 3.5–5.3)
SODIUM: 138 mmol/L (ref 135–146)
Total Bilirubin: 0.3 mg/dL (ref 0.2–1.2)
Total Protein: 7.4 g/dL (ref 6.1–8.1)

## 2016-10-18 LAB — CBC WITH DIFFERENTIAL/PLATELET
BASOS PCT: 1 %
Basophils Absolute: 70 cells/uL (ref 0–200)
EOS PCT: 2 %
Eosinophils Absolute: 140 cells/uL (ref 15–500)
HEMATOCRIT: 46.7 % (ref 38.5–50.0)
Hemoglobin: 16.5 g/dL (ref 13.2–17.1)
LYMPHS PCT: 47 %
Lymphs Abs: 3290 cells/uL (ref 850–3900)
MCH: 31.4 pg (ref 27.0–33.0)
MCHC: 35.3 g/dL (ref 32.0–36.0)
MCV: 89 fL (ref 80.0–100.0)
MONO ABS: 420 {cells}/uL (ref 200–950)
MPV: 10.1 fL (ref 7.5–12.5)
Monocytes Relative: 6 %
Neutro Abs: 3080 cells/uL (ref 1500–7800)
Neutrophils Relative %: 44 %
PLATELETS: 235 10*3/uL (ref 140–400)
RBC: 5.25 MIL/uL (ref 4.20–5.80)
RDW: 15.1 % — AB (ref 11.0–15.0)
WBC: 7 10*3/uL (ref 3.8–10.8)

## 2016-10-19 LAB — RPR

## 2016-10-19 LAB — T-HELPER CELL (CD4) - (RCID CLINIC ONLY)
CD4 T CELL HELPER: 14 % — AB (ref 33–55)
CD4 T Cell Abs: 500 /uL (ref 400–2700)

## 2016-10-21 LAB — HIV-1 RNA QUANT-NO REFLEX-BLD
HIV 1 RNA Quant: <20 copies/mL (ref <20)
HIV-1 RNA Quant, Log: <1.3 Log copies/mL (ref <1.30)

## 2016-10-31 ENCOUNTER — Telehealth: Payer: Self-pay | Admitting: *Deleted

## 2016-10-31 ENCOUNTER — Other Ambulatory Visit: Payer: Self-pay | Admitting: *Deleted

## 2016-10-31 DIAGNOSIS — B20 Human immunodeficiency virus [HIV] disease: Secondary | ICD-10-CM

## 2016-10-31 MED ORDER — EMTRICITABINE-TENOFOVIR AF 200-25 MG PO TABS: 1.0000 | ORAL_TABLET | Freq: Every day | ORAL | 0 refills | Status: DC

## 2016-10-31 MED ORDER — DOLUTEGRAVIR SODIUM 50 MG PO TABS
50.0000 mg | ORAL_TABLET | Freq: Every day | ORAL | 0 refills | Status: DC
Start: 2016-10-31 — End: 2016-12-07

## 2016-10-31 NOTE — Telephone Encounter (Signed)
Patient called requesting a one month refill on his hiv meds until his appt with MD in February. Rx sent

## 2016-11-01 ENCOUNTER — Ambulatory Visit: Payer: Federal, State, Local not specified - PPO | Admitting: Internal Medicine

## 2016-11-17 ENCOUNTER — Ambulatory Visit (INDEPENDENT_AMBULATORY_CARE_PROVIDER_SITE_OTHER): Payer: Federal, State, Local not specified - PPO | Admitting: Internal Medicine

## 2016-11-17 ENCOUNTER — Encounter: Payer: Self-pay | Admitting: Internal Medicine

## 2016-11-17 DIAGNOSIS — F250 Schizoaffective disorder, bipolar type: Secondary | ICD-10-CM

## 2016-11-17 DIAGNOSIS — F17210 Nicotine dependence, cigarettes, uncomplicated: Secondary | ICD-10-CM

## 2016-11-17 DIAGNOSIS — Z0000 Encounter for general adult medical examination without abnormal findings: Secondary | ICD-10-CM | POA: Diagnosis not present

## 2016-11-17 DIAGNOSIS — B20 Human immunodeficiency virus [HIV] disease: Secondary | ICD-10-CM

## 2016-11-17 DIAGNOSIS — F259 Schizoaffective disorder, unspecified: Secondary | ICD-10-CM

## 2016-11-17 NOTE — Assessment & Plan Note (Signed)
I asked him to schedule a visit with his primary care provider. He needs a screening colonoscopy

## 2016-11-17 NOTE — Assessment & Plan Note (Signed)
He has had some sleep disruption in the past 2 weeks but he does not appear to be manic or depressed. He has a [facility].

## 2016-11-17 NOTE — Progress Notes (Signed)
Patient Active Problem List   Diagnosis Date Noted  . HIV (human immunodeficiency virus infection) (HCC) 05/13/2011    Priority: High  . Schizo affective schizophrenia (HCC) 05/13/2011    Priority: High  . Routine general medical examination at a health care facility 06/23/2015  . Dyslipidemia 01/08/2014  . Cigarette smoker 05/26/2011  . Bipolar 1 disorder (HCC) 05/13/2011  . Rash, skin 05/13/2011    Patient's Medications  New Prescriptions   No medications on file  Previous Medications   DOLUTEGRAVIR (TIVICAY) 50 MG TABLET    Take 1 tablet (50 mg total) by mouth daily.   EMTRICITABINE-TENOFOVIR AF (DESCOVY) 200-25 MG TABLET    Take 1 tablet by mouth daily.   OLANZAPINE (ZYPREXA) 20 MG TABLET    Take 2 tablets (40 mg total) by mouth at bedtime.   TRIAMCINOLONE CREAM (KENALOG) 0.1 %    Apply 1 application topically 2 (two) times daily.  Modified Medications   No medications on file  Discontinued Medications   No medications on file    Subjective: Randall Garrett is in for his routine HIV follow-up visit. He thinks he may have missed only 1 or 2 doses of his Descovy and Tivicay since his last visit. He has no problems tolerating them. He also continues to take his Zyprexa. The past 2 weeks he's had some fitful sleeping and wonders if his Zyprexa dose needs to be increased. He has not had any racing thoughts or prolonged periods of sleeplessness. He has not felt depressed or anxious. He twisted his right knee at the gym 2 weeks ago but it is feeling better now. He is still smoking about 5 cigars daily but hopes to quit by his 56th birthday next month. He has not seen his primary care provider in many years. He cannot recall his provider's name. He has never had a screening colonoscopy. He has been bothered by urinary frequency and nocturia.  Review of Systems: Review of Systems  Constitutional: Negative for chills, diaphoresis, fever, malaise/fatigue and weight loss.  HENT:  Negative for sore throat.   Respiratory: Negative for cough, sputum production and shortness of breath.   Cardiovascular: Negative for chest pain.  Gastrointestinal: Negative for abdominal pain, diarrhea, heartburn, nausea and vomiting.  Genitourinary: Positive for frequency. Negative for dysuria.  Musculoskeletal: Negative for joint pain and myalgias.  Skin: Negative for rash.  Neurological: Negative for dizziness and headaches.  Psychiatric/Behavioral: Negative for depression and substance abuse. The patient has insomnia. The patient is not nervous/anxious.     Past Medical History:  Diagnosis Date  . Bipolar 1 disorder (HCC) 05/13/2011  . Cigarette smoker 05/26/2011   Have asked him to continue consideration of quitting smoking completely.   Marland Kitchen HIV (human immunodeficiency virus infection) (HCC) 05/13/2011  . HIV infection (HCC)   . Rash, skin 05/13/2011   Bilateral arm    . Schizo affective schizophrenia (HCC) 05/13/2011    Social History  Substance Use Topics  . Smoking status: Current Every Day Smoker    Packs/day: 1.00    Years: 32.00    Types: Cigarettes  . Smokeless tobacco: Never Used     Comment: going to try to quit this year before 2016  . Alcohol use No    Family History  Problem Relation Age of Onset  . Diabetes Mother   . Hypertension Mother   . Hyperlipidemia Mother   . Stroke Mother   . Diabetes Father   .  Hypertension Father   . Hyperlipidemia Father   . Diabetes Sister   . Hypertension Sister   . Hyperlipidemia Sister   . Stroke Sister   . Diabetes Brother   . Hypertension Brother   . Hyperlipidemia Brother   . Cancer Maternal Grandmother     No Known Allergies  Objective:  Vitals:   11/17/16 1026  BP: (!) 144/92  Pulse: 63  Temp: 97.9 F (36.6 C)  TempSrc: Oral  Weight: 243 lb (110.2 kg)  Height: 6' (1.829 m)   Body mass index is 32.96 kg/m.  Physical Exam  Constitutional: He is oriented to person, place, and time.  He is talkative and  in good spirits as usual.  HENT:  Mouth/Throat: No oropharyngeal exudate.  He has had dental work on his front upper tooth and its much straighter now.  Eyes: Conjunctivae are normal.  Cardiovascular: Normal rate and regular rhythm.   No murmur heard. Pulmonary/Chest: Effort normal and breath sounds normal.  Abdominal: Soft. He exhibits no mass. There is no tenderness.  Musculoskeletal: Normal range of motion. He exhibits no edema or tenderness.  Neurological: He is alert and oriented to person, place, and time.  Skin: No rash noted.  Psychiatric: Mood and affect normal.    Lab Results Lab Results  Component Value Date   WBC 7.0 10/18/2016   HGB 16.5 10/18/2016   HCT 46.7 10/18/2016   MCV 89.0 10/18/2016   PLT 235 10/18/2016    Lab Results  Component Value Date   CREATININE 1.19 10/18/2016   BUN 13 10/18/2016   NA 138 10/18/2016   K 4.7 10/18/2016   CL 106 10/18/2016   CO2 24 10/18/2016    Lab Results  Component Value Date   ALT 8 (L) 10/18/2016   AST 16 10/18/2016   ALKPHOS 76 10/18/2016   BILITOT 0.3 10/18/2016    Lab Results  Component Value Date   CHOL 147 10/27/2015   HDL 33 (L) 10/27/2015   LDLCALC 82 10/27/2015   TRIG 158 (H) 10/27/2015   CHOLHDL 4.5 10/27/2015   HIV 1 RNA Quant (copies/mL)  Date Value  10/18/2016 <20  10/27/2015 <20  01/22/2015 45 (H)   CD4 T Cell Abs (/uL)  Date Value  10/18/2016 500  10/27/2015 390 (L)  01/22/2015 370 (L)     Problem List Items Addressed This Visit      High   HIV (human immunodeficiency virus infection) (HCC)    His infection is under excellent long-term control. He will continue his current regimen and follow-up after lab work in 6 months.      Relevant Orders   T-helper cell (CD4)- ([facility] clinic only)   HIV 1 RNA quant-no reflex-bld   Schizo affective schizophrenia (HCC)    He has had some sleep disruption in the past 2 weeks but he does not appear to be manic or depressed. [facility].        Unprioritized   Cigarette smoker    I talked him about setting a specific plan to quit smoking his cigars. I asked him to cut down over the next month and start to get rid of all the paraphernalia that supports his habit.      Routine general medical examination at a health care facility    I asked him to schedule a visit with his primary care provider. He needs a screening colonoscopy  Cliffton Asters, MD [facility] for Infectious Disease [facility] [phone] pager   [phone] cell 11/17/2016, 10:50 AM

## 2016-11-17 NOTE — Assessment & Plan Note (Signed)
I talked him about setting a specific plan to quit smoking his cigars. I asked him to cut down over the next month and start to get rid of all the paraphernalia that supports his habit.

## 2016-11-17 NOTE — Assessment & Plan Note (Signed)
His infection is under excellent long-term control. He will continue his current regimen and follow-up after lab work in 6 months.

## 2016-12-07 ENCOUNTER — Other Ambulatory Visit: Payer: Self-pay | Admitting: Internal Medicine

## 2016-12-07 DIAGNOSIS — B20 Human immunodeficiency virus [HIV] disease: Secondary | ICD-10-CM

## 2017-02-15 ENCOUNTER — Ambulatory Visit (INDEPENDENT_AMBULATORY_CARE_PROVIDER_SITE_OTHER): Payer: Federal, State, Local not specified - PPO | Admitting: Family

## 2017-02-15 ENCOUNTER — Encounter: Payer: Self-pay | Admitting: Family

## 2017-02-15 VITALS — BP 126/68 | HR 67 | Temp 98.3°F | Resp 16 | Ht 72.0 in | Wt 241.1 lb

## 2017-02-15 DIAGNOSIS — F17210 Nicotine dependence, cigarettes, uncomplicated: Secondary | ICD-10-CM

## 2017-02-15 DIAGNOSIS — Z0000 Encounter for general adult medical examination without abnormal findings: Secondary | ICD-10-CM | POA: Diagnosis not present

## 2017-02-15 DIAGNOSIS — F319 Bipolar disorder, unspecified: Secondary | ICD-10-CM

## 2017-02-15 DIAGNOSIS — B20 Human immunodeficiency virus [HIV] disease: Secondary | ICD-10-CM

## 2017-02-15 DIAGNOSIS — Z1211 Encounter for screening for malignant neoplasm of colon: Secondary | ICD-10-CM

## 2017-02-15 DIAGNOSIS — E785 Hyperlipidemia, unspecified: Secondary | ICD-10-CM

## 2017-02-15 MED ORDER — SILDENAFIL CITRATE 100 MG PO TABS: 50.0000 mg | ORAL_TABLET | Freq: Every day | ORAL | 0 refills | Status: DC | PRN

## 2017-02-15 NOTE — Assessment & Plan Note (Signed)
Not currently maintained on medication. Continue lifestyle management pending lipid profile results.

## 2017-02-15 NOTE — Patient Instructions (Addendum)
Thank you for choosing [facility].  SUMMARY AND INSTRUCTIONS:  Medication:  Your prescription(s) have been submitted to your pharmacy or been printed and provided for you. Please take as directed and contact our office if you believe you are having problem(s) with the medication(s) or have any questions.  Labs:  Please stop by the lab on the lower level of the building for your blood work. Your results will be released to MyChart (or called to you) after review, usually within 72 hours after test completion. If any changes need to be made, you will be notified at that same time.  1.) The lab is open from 7:30am to 5:30 pm Monday-Friday 2.) No appointment is necessary 3.) Fasting (if needed) is 6-8 hours after food and drink; black coffee and water are okay   Follow up:  If your symptoms worsen or fail to improve, please contact our office for further instruction, or in case of emergency go directly to the emergency room at the closest medical facility.    Health Maintenance, Male A healthy lifestyle and preventive care is important for your health and wellness. Ask your health care provider about what schedule of regular examinations is right for you. What should I know about weight and diet?  Eat a Healthy Diet  Eat plenty of vegetables, fruits, whole grains, low-fat dairy products, and lean protein.  Do not eat a lot of foods high in solid fats, added sugars, or salt. Maintain a Healthy Weight  Regular exercise can help you achieve or maintain a healthy weight. You should:  Do at least 150 minutes of exercise each week. The exercise should increase your heart rate and make you sweat (moderate-intensity exercise).  Do strength-training exercises at least twice a week. Watch Your Levels of Cholesterol and Blood Lipids  Have your blood tested for lipids and cholesterol every 5 years starting at 55 years of age. If you are at high risk for heart disease, you should start  having your blood tested when you are 55 years old. You may need to have your cholesterol levels checked more often if:  Your lipid or cholesterol levels are high.  You are older than 55 years of age.  You are at high risk for heart disease. What should I know about cancer screening? Many types of cancers can be detected early and may often be prevented. Lung Cancer  You should be screened every year for lung cancer if:  You are a current smoker who has smoked for at least 30 years.  You are a former smoker who has quit within the past 15 years.  Talk to your health care provider about your screening options, when you should start screening, and how often you should be screened. Colorectal Cancer  Routine colorectal cancer screening usually begins at 55 years of age and should be repeated every 5-10 years until you are 55 years old. You may need to be screened more often if early forms of precancerous polyps or small growths are found. Your health care provider may recommend screening at an earlier age if you have risk factors for colon cancer.  Your health care provider may recommend using home test kits to check for hidden blood in the stool.  A small camera at the end of a tube can be used to examine your colon (sigmoidoscopy or colonoscopy). This checks for the earliest forms of colorectal cancer. Prostate and Testicular Cancer  Depending on your age and overall health, your health care   provider may do certain tests to screen for prostate and testicular cancer.  Talk to your health care provider about any symptoms or concerns you have about testicular or prostate cancer. Skin Cancer  Check your skin from head to toe regularly.  Tell your health care provider about any new moles or changes in moles, especially if:  There is a change in a mole's size, shape, or color.  You have a mole that is larger than a pencil eraser.  Always use sunscreen. Apply sunscreen liberally and  repeat throughout the day.  Protect yourself by wearing long sleeves, pants, a wide-brimmed hat, and sunglasses when outside. What should I know about heart disease, diabetes, and high blood pressure?  If you are 18-39 years of age, have your blood pressure checked every 3-5 years. If you are 40 years of age or older, have your blood pressure checked every year. You should have your blood pressure measured twice-once when you are at a hospital or clinic, and once when you are not at a hospital or clinic. Record the average of the two measurements. To check your blood pressure when you are not at a hospital or clinic, you can use:  An automated blood pressure machine at a pharmacy.  A home blood pressure monitor.  Talk to your health care provider about your target blood pressure.  If you are between 45-79 years old, ask your health care provider if you should take aspirin to prevent heart disease.  Have regular diabetes screenings by checking your fasting blood sugar level.  If you are at a normal weight and have a low risk for diabetes, have this test once every three years after the age of 45.  If you are overweight and have a high risk for diabetes, consider being tested at a younger age or more often.  A one-time screening for abdominal aortic aneurysm (AAA) by ultrasound is recommended for men aged 65-75 years who are current or former smokers. What should I know about preventing infection? Hepatitis B  If you have a higher risk for hepatitis B, you should be screened for this virus. Talk with your health care provider to find out if you are at risk for hepatitis B infection. Hepatitis C  Blood testing is recommended for:  Everyone born from 1945 through 1965.  Anyone with known risk factors for hepatitis C. Sexually Transmitted Diseases (STDs)  You should be screened each year for STDs including gonorrhea and chlamydia if:  You are sexually active and are younger than 55  years of age.  You are older than 55 years of age and your health care provider tells you that you are at risk for this type of infection.  Your sexual activity has changed since you were last screened and you are at an increased risk for chlamydia or gonorrhea. Ask your health care provider if you are at risk.  Talk with your health care provider about whether you are at high risk of being infected with HIV. Your health care provider may recommend a prescription medicine to help prevent HIV infection. What else can I do?  Schedule regular health, dental, and eye exams.  Stay current with your vaccines (immunizations).  Do not use any tobacco products, such as cigarettes, chewing tobacco, and e-cigarettes. If you need help quitting, ask your health care provider.  Limit alcohol intake to no more than 2 drinks per day. One drink equals 12 ounces of beer, 5 ounces of wine, or 1 ounces   of hard liquor.  Do not use street drugs.  Do not share needles.  Ask your health care provider for help if you need support or information about quitting drugs.  Tell your health care provider if you often feel depressed.  Tell your health care provider if you have ever been abused or do not feel safe at home. This information is not intended to replace advice given to you by your health care provider. Make sure you discuss any questions you have with your health care provider. Document Released: 03/24/2008 Document Revised: 05/25/2016 Document Reviewed: 06/30/2015 Elsevier Interactive Patient Education  2017 [employer]  

## 2017-02-15 NOTE — Assessment & Plan Note (Signed)
Stable with no adverse side effects and managed by infectious disease. Follow-up and changes per infectious disease.

## 2017-02-15 NOTE — Assessment & Plan Note (Signed)
Appears stable with current medication and no adverse side effects or exacerbations. No recent episodes of mania per patient. Continue current dosage of olanzapine.

## 2017-02-15 NOTE — Assessment & Plan Note (Signed)
Currently in the precontemplation stage of quitting and continues to smoke approximately one pack per day with a total of 32 pack years to date. Encouraged tobacco cessation to reduce risk for respiratory, cardiovascular, and malignant disease in the future. Continue to monitor.

## 2017-02-15 NOTE — Assessment & Plan Note (Signed)
1) Anticipatory Guidance: Discussed importance of wearing a seatbelt while driving and not texting while driving; changing batteries in smoke detector at least once annually; wearing suntan lotion when outside; eating a balanced and moderate diet; getting physical activity at least 30 minutes per day.  2) Immunizations / Screenings / Labs:  All immunizations are up-to-date per recommendations. Due for a vision exam encouraged to be completed independently. Obtain PSA for prostate cancer screening. Due for colon cancer screening with referral to gastroenterology placed for colonoscopy. All other screenings are up-to-date per recommendations. Obtain CBC, CMET, and lipid profile.    Overall well exam with risk factors for cardiovascular disease including tobacco use, obesity, and dyslipidemia. Recommend weight loss of 5-10% of current body weight to nutrition and physical activity changes. Encouraged tobacco cessation to reduce risk for respiratory, cardiovascular, malignant disease in the future. Continue other healthy lifestyle behaviors and choices. Follow-up prevention exam in 1 year. Follow-up office visit pending blood work and for chronic conditions.

## 2017-02-15 NOTE — Progress Notes (Signed)
Subjective:    Patient ID: Randall Garrett, male    DOB: December 13, 1960, [age] y.o.   MRN: [id number]  Chief Complaint  Patient presents with  . CPE    not fasting    HPI:  Randall Garrett is a 56 y.o. male who presents today for an annual wellness visit.   1) Health Maintenance -   Diet - Averages about 3 meals per day consisting of a regular diet; Caffeine intake of about 3-4 cups per day  Exercise - Walks most of the time for transportation.    2) Preventative Exams / Immunizations:  Dental -- Up to date  Vision -- Due for exam   Health Maintenance  Topic Date Due  . COLONOSCOPY  01/03/2012  . INFLUENZA VACCINE  05/10/2017  . TETANUS/TDAP  11/15/2022  . Hepatitis C Screening  Completed  . HIV Screening  Completed    Immunization History  Administered Date(s) Administered  . Hepatitis A 08/04/2009  . Hepatitis B 08/04/2009  . Influenza Split 08/08/2012  . Influenza Whole 05/26/2011  . Influenza,inj,Quad PF,36+ Mos 07/09/2014  . PPD Test 10/06/2009, 05/12/2011  . Pneumococcal Polysaccharide-23 12/30/2008  . Tdap 11/15/2012     No Known Allergies   Outpatient Medications Prior to Visit  Medication Sig Dispense Refill  . DESCOVY 200-25 MG tablet TAKE 1 TABLET BY MOUTH DAILY. 30 tablet 5  . OLANZapine (ZYPREXA) 20 MG tablet Take 2 tablets (40 mg total) by mouth at bedtime. 60 tablet 3  . TIVICAY 50 MG tablet TAKE 1 TABLET (50 MG TOTAL) BY MOUTH DAILY. 30 tablet 5  . triamcinolone cream (KENALOG) 0.1 % Apply 1 application topically 2 (two) times daily. 454 g 11   No facility-administered medications prior to visit.      Past Medical History:  Diagnosis Date  . Bipolar 1 disorder (HCC) 05/13/2011  . Cigarette smoker 05/26/2011   Have asked him to continue consideration of quitting smoking completely.   Marland Kitchen. HIV (human immunodeficiency virus infection) (HCC) 05/13/2011  . HIV infection (HCC)   . Rash, skin 05/13/2011   Bilateral arm    . Schizo affective  schizophrenia (HCC) 05/13/2011     Past Surgical History:  Procedure Laterality Date  . Broke Left arm  1991     Family History  Problem Relation Age of Onset  . Diabetes Mother   . Hypertension Mother   . Hyperlipidemia Mother   . Stroke Mother   . Diabetes Father   . Hypertension Father   . Hyperlipidemia Father   . Diabetes Sister   . Hypertension Sister   . Hyperlipidemia Sister   . Stroke Sister   . Diabetes Brother   . Hypertension Brother   . Hyperlipidemia Brother   . Cancer Maternal Grandmother      Social History   Social History  . Marital status: Single    Spouse name: N/A  . Number of children: 0  . Years of education: 3616   Occupational History  . Retired     Social History Main Topics  . Smoking status: Current Every Day Smoker    Packs/day: 1.00    Years: 32.00    Types: Cigarettes  . Smokeless tobacco: Never Used     Comment: going to try to quit this year before 2016  . Alcohol use 1.2 oz/week    2 Standard drinks or equivalent per week     Comment: Occasional  . Drug use: No  . Sexual activity:  No     Comment: declined condoms   Other Topics Concern  . Not on file   Social History Narrative   Fun: Exercise and outdoor activity.    Denies religious beliefs effecting health care.       Review of Systems  Constitutional: Denies fever, chills, fatigue, or significant weight gain/loss. HENT: Head: Denies headache or neck pain Ears: Denies changes in hearing, ringing in ears, earache, drainage Nose: Denies discharge, stuffiness, itching, nosebleed, sinus pain Throat: Denies sore throat, hoarseness, dry mouth, sores, thrush Eyes: Denies loss/changes in vision, pain, redness, blurry/double vision, flashing lights Cardiovascular: Denies chest pain/discomfort, tightness, palpitations, shortness of breath with activity, difficulty lying down, swelling, sudden awakening with shortness of breath Respiratory: Denies shortness of breath,  cough, sputum production, wheezing Gastrointestinal: Denies dysphasia, heartburn, change in appetite, nausea, change in bowel habits, rectal bleeding, constipation, diarrhea, yellow skin or eyes Genitourinary: Denies frequency, urgency, burning/pain, blood in urine, incontinence, change in urinary strength. Musculoskeletal: Denies muscle/joint pain, stiffness, back pain, redness or swelling of joints, trauma Skin: Denies rashes, lumps, itching, dryness, color changes, or hair/nail changes Neurological: Denies dizziness, fainting, seizures, weakness, numbness, tingling, tremor Psychiatric - Denies nervousness, stress, depression or memory loss Endocrine: Denies heat or cold intolerance, sweating, frequent urination, excessive thirst, changes in appetite Hematologic: Denies ease of bruising or bleeding     Objective:     BP 126/68 (BP Location: Left Arm, Patient Position: Sitting, Cuff Size: Large)   Pulse 67   Temp 98.3 F (36.8 C) (Oral)   Resp 16   Ht 6' (1.829 m)   Wt 241 lb 1.9 oz (109.4 kg)   SpO2 94%   BMI 32.70 kg/m  Nursing note and vital signs reviewed.  Physical Exam  Constitutional: He is oriented to person, place, and time. He appears well-developed and well-nourished.  HENT:  Head: Normocephalic.  Right Ear: Hearing, tympanic membrane, external ear and ear canal normal.  Left Ear: Hearing, tympanic membrane, external ear and ear canal normal.  Nose: Nose normal.  Mouth/Throat: Uvula is midline, oropharynx is clear and moist and mucous membranes are normal.  Eyes: Conjunctivae and EOM are normal. Pupils are equal, round, and reactive to light.  Neck: Neck supple. No JVD present. No tracheal deviation present. No thyromegaly present.  Cardiovascular: Normal rate, regular rhythm, normal heart sounds and intact distal pulses.   Pulmonary/Chest: Effort normal and breath sounds normal.  Abdominal: Soft. Bowel sounds are normal. He exhibits no distension and no mass. There  is no tenderness. There is no rebound and no guarding.  Musculoskeletal: Normal range of motion. He exhibits no edema or tenderness.  Lymphadenopathy:    He has no cervical adenopathy.  Neurological: He is alert and oriented to person, place, and time. He has normal reflexes. No cranial nerve deficit. He exhibits normal muscle tone. Coordination normal.  Skin: Skin is warm and dry.  Psychiatric: He has a normal mood and affect. His behavior is normal. Judgment and thought content normal.       Assessment & Plan:   Problem List Items Addressed This Visit      Other   Bipolar 1 disorder (HCC)    Appears stable with current medication and no adverse side effects or exacerbations. No recent episodes of mania per patient. Continue current dosage of olanzapine.      HIV (human immunodeficiency virus infection) (HCC)    Stable with no adverse side effects and managed by infectious disease. Follow-up and changes per  infectious disease.      Cigarette smoker    Currently in the precontemplation stage of quitting and continues to smoke approximately one pack per day with a total of 32 pack years to date. Encouraged tobacco cessation to reduce risk for respiratory, cardiovascular, and malignant disease in the future. Continue to monitor.      Dyslipidemia    Not currently maintained on medication. Continue lifestyle management pending lipid profile results.      Routine general medical examination at a health care facility - Primary    1) Anticipatory Guidance: Discussed importance of wearing a seatbelt while driving and not texting while driving; changing batteries in smoke detector at least once annually; wearing suntan lotion when outside; eating a balanced and moderate diet; getting physical activity at least 30 minutes per day.  2) Immunizations / Screenings / Labs:  All immunizations are up-to-date per recommendations. Due for a vision exam encouraged to be completed independently.  Obtain PSA for prostate cancer screening. Due for colon cancer screening with referral to gastroenterology placed for colonoscopy. All other screenings are up-to-date per recommendations. Obtain CBC, CMET, and lipid profile.    Overall well exam with risk factors for cardiovascular disease including tobacco use, obesity, and dyslipidemia. Recommend weight loss of 5-10% of current body weight to nutrition and physical activity changes. Encouraged tobacco cessation to reduce risk for respiratory, cardiovascular, malignant disease in the future. Continue other healthy lifestyle behaviors and choices. Follow-up prevention exam in 1 year. Follow-up office visit pending blood work and for chronic conditions.       Relevant Orders   CBC   Comprehensive metabolic panel   Lipid panel   PSA    Other Visit Diagnoses    Screen for colon cancer       Relevant Orders   Ambulatory referral to Gastroenterology       I am having Mr. Mcmanamon start on sildenafil. I am also having him maintain his triamcinolone cream, OLANZapine, TIVICAY, and DESCOVY.   Meds ordered this encounter  Medications  . sildenafil (VIAGRA) 100 MG tablet    Sig: Take 0.5-1 tablets (50-100 mg total) by mouth daily as needed for erectile dysfunction.    Dispense:  4 tablet    Refill:  0    Order Specific Question:   Supervising Provider    Answer:   Hillard Danker A [[id number]     Follow-up: Return in about 6 months (around 08/18/2017), or if symptoms worsen or fail to improve.   Jeanine Luz, FNP

## 2017-02-17 ENCOUNTER — Other Ambulatory Visit (INDEPENDENT_AMBULATORY_CARE_PROVIDER_SITE_OTHER): Payer: Federal, State, Local not specified - PPO

## 2017-02-17 DIAGNOSIS — Z0000 Encounter for general adult medical examination without abnormal findings: Secondary | ICD-10-CM

## 2017-02-17 LAB — CBC
HEMATOCRIT: 46.9 % (ref 39.0–52.0)
HEMOGLOBIN: 16.5 g/dL (ref 13.0–17.0)
MCHC: 35.3 g/dL (ref 30.0–36.0)
MCV: 88.5 fl (ref 78.0–100.0)
Platelets: 215 10*3/uL (ref 150.0–400.0)
RBC: 5.3 Mil/uL (ref 4.22–5.81)
RDW: 15.6 % — AB (ref 11.5–15.5)
WBC: 7.2 10*3/uL (ref 4.0–10.5)

## 2017-02-17 LAB — LIPID PANEL
CHOLESTEROL: 167 mg/dL (ref 0–200)
HDL: 39.6 mg/dL (ref >39.00)
LDL CALC: 110 mg/dL — AB (ref 0–99)
NONHDL: 127.87
Total CHOL/HDL Ratio: 4
Triglycerides: 90 mg/dL (ref 0.0–149.0)
VLDL: 18 mg/dL (ref 0.0–40.0)

## 2017-02-17 LAB — COMPREHENSIVE METABOLIC PANEL
ALT: 8 U/L (ref 0–53)
AST: 14 U/L (ref 0–37)
Albumin: 4.2 g/dL (ref 3.5–5.2)
Alkaline Phosphatase: 61 U/L (ref 39–117)
BUN: 16 mg/dL (ref 6–23)
CALCIUM: 9.3 mg/dL (ref 8.4–10.5)
CO2: 26 meq/L (ref 19–32)
Chloride: 106 mEq/L (ref 96–112)
Creatinine, Ser: 1.14 mg/dL (ref 0.40–1.50)
GFR: 85.73 mL/min (ref >60.00)
Glucose, Bld: 106 mg/dL — ABNORMAL HIGH (ref 70–99)
POTASSIUM: 4.6 meq/L (ref 3.5–5.1)
Sodium: 138 mEq/L (ref 135–145)
Total Bilirubin: 0.4 mg/dL (ref 0.2–1.2)
Total Protein: 7.1 g/dL (ref 6.0–8.3)

## 2017-02-17 LAB — PSA: PSA: 0.46 ng/mL (ref 0.10–4.00)

## 2017-02-23 ENCOUNTER — Telehealth: Payer: Self-pay | Admitting: Family

## 2017-02-23 MED ORDER — PRAVASTATIN SODIUM 20 MG PO TABS: 20.0000 mg | ORAL_TABLET | Freq: Every day | ORAL | 2 refills | Status: DC

## 2017-02-23 NOTE — Telephone Encounter (Signed)
Please send in preferred cholesterol medication. Thanks

## 2017-02-23 NOTE — Telephone Encounter (Signed)
Patient called back.  I gave lab results.  Patient will take cholesterol meds.  Patient uses [facility] on [address].  Please call patient back if he needs to schedule follow up.

## 2017-02-23 NOTE — Telephone Encounter (Signed)
Pravastatin sent to pharmacy 

## 2017-02-24 NOTE — Telephone Encounter (Signed)
LVM letting pt know.  

## 2017-03-20 ENCOUNTER — Encounter: Payer: Self-pay | Admitting: Family

## 2017-05-29 ENCOUNTER — Other Ambulatory Visit: Payer: Self-pay | Admitting: Internal Medicine

## 2017-05-29 DIAGNOSIS — B20 Human immunodeficiency virus [HIV] disease: Secondary | ICD-10-CM

## 2017-09-22 ENCOUNTER — Other Ambulatory Visit: Payer: Self-pay | Admitting: Internal Medicine

## 2017-09-22 DIAGNOSIS — B20 Human immunodeficiency virus [HIV] disease: Secondary | ICD-10-CM

## 2017-10-12 ENCOUNTER — Other Ambulatory Visit: Payer: Federal, State, Local not specified - PPO

## 2017-10-12 DIAGNOSIS — Z21 Asymptomatic human immunodeficiency virus [HIV] infection status: Secondary | ICD-10-CM

## 2017-10-12 DIAGNOSIS — B20 Human immunodeficiency virus [HIV] disease: Secondary | ICD-10-CM

## 2017-10-13 LAB — T-HELPER CELL (CD4) - (RCID CLINIC ONLY)
CD4 % Helper T Cell: 11 % — ABNORMAL LOW (ref 33–55)
CD4 T Cell Abs: 470 /uL (ref 400–2700)

## 2017-10-16 LAB — HIV-1 RNA QUANT-NO REFLEX-BLD
HIV 1 RNA QUANT: 25 {copies}/mL — AB
HIV-1 RNA QUANT, LOG: 1.4 {Log_copies}/mL — AB

## 2017-10-23 ENCOUNTER — Other Ambulatory Visit: Payer: Self-pay | Admitting: *Deleted

## 2017-10-23 DIAGNOSIS — B20 Human immunodeficiency virus [HIV] disease: Secondary | ICD-10-CM

## 2017-10-23 MED ORDER — EMTRICITABINE-TENOFOVIR AF 200-25 MG PO TABS: 1.0000 | ORAL_TABLET | Freq: Every day | ORAL | 0 refills | Status: DC

## 2017-10-23 MED ORDER — DOLUTEGRAVIR SODIUM 50 MG PO TABS: 50.0000 mg | ORAL_TABLET | Freq: Every day | ORAL | 0 refills | Status: DC

## 2017-10-23 NOTE — Telephone Encounter (Signed)
appointment scheduled for 10/31/17 with Dr. Orvan Falconerampbell

## 2017-10-31 ENCOUNTER — Ambulatory Visit: Payer: Federal, State, Local not specified - PPO | Admitting: Internal Medicine

## 2017-11-14 ENCOUNTER — Ambulatory Visit (INDEPENDENT_AMBULATORY_CARE_PROVIDER_SITE_OTHER): Payer: Federal, State, Local not specified - PPO | Admitting: Internal Medicine

## 2017-11-14 ENCOUNTER — Encounter: Payer: Self-pay | Admitting: Internal Medicine

## 2017-11-14 DIAGNOSIS — B20 Human immunodeficiency virus [HIV] disease: Secondary | ICD-10-CM

## 2017-11-14 MED ORDER — DOLUTEGRAVIR SODIUM 50 MG PO TABS: 50.0000 mg | ORAL_TABLET | Freq: Every day | ORAL | 11 refills | Status: DC

## 2017-11-14 MED ORDER — EMTRICITABINE-TENOFOVIR AF 200-25 MG PO TABS
1.0000 | ORAL_TABLET | Freq: Every day | ORAL | 11 refills | Status: DC
Start: 2017-11-14 — End: 2018-09-13

## 2017-11-14 NOTE — Progress Notes (Signed)
Patient Active Problem List   Diagnosis Date Noted  . HIV (human immunodeficiency virus infection) (HCC) 05/13/2011    Priority: High  . Schizo affective schizophrenia (HCC) 05/13/2011    Priority: High  . Routine general medical examination at a health care facility 06/23/2015  . Dyslipidemia 01/08/2014  . Cigarette smoker 05/26/2011  . Bipolar 1 disorder (HCC) 05/13/2011  . Rash, skin 05/13/2011    Patient's Medications  New Prescriptions   No medications on file  Previous Medications   OLANZAPINE (ZYPREXA) 20 MG TABLET    Take 2 tablets (40 mg total) by mouth at bedtime.   PRAVASTATIN (PRAVACHOL) 20 MG TABLET    Take 1 tablet (20 mg total) by mouth daily.   SILDENAFIL (VIAGRA) 100 MG TABLET    Take 0.5-1 tablets (50-100 mg total) by mouth daily as needed for erectile dysfunction.   TRIAMCINOLONE CREAM (KENALOG) 0.1 %    Apply 1 application topically 2 (two) times daily.  Modified Medications   Modified Medication Previous Medication   DOLUTEGRAVIR (TIVICAY) 50 MG TABLET dolutegravir (TIVICAY) 50 MG tablet      Take 1 tablet (50 mg total) by mouth daily.    Take 1 tablet (50 mg total) by mouth daily.   EMTRICITABINE-TENOFOVIR AF (DESCOVY) 200-25 MG TABLET emtricitabine-tenofovir AF (DESCOVY) 200-25 MG tablet      Take 1 tablet by mouth daily.    Take 1 tablet by mouth daily.  Discontinued Medications   No medications on file    Subjective: Randall Garrett is in for his routine HIV follow-up visit.  He has had no problems obtaining, taking or tolerating his Descovy and Tivicay.  He takes it each morning about 30 minutes after he wakes up.  He recalls missing only one dose in the past 6 months.  He is feeling well and has no current complaints.  He is still smoking cigarettes and has no current plans to quit.  Next week he will be leaving for 1 week in [place] before taking a bus to [place] and spending 1 week there.  Review of Systems: Review of Systems    Constitutional: Negative for chills, diaphoresis, fever, malaise/fatigue and weight loss.  HENT: Negative for sore throat.   Respiratory: Negative for cough, sputum production and shortness of breath.   Cardiovascular: Negative for chest pain.  Gastrointestinal: Negative for abdominal pain, diarrhea, heartburn, nausea and vomiting.  Genitourinary: Negative for dysuria and frequency.  Musculoskeletal: Negative for joint pain and myalgias.  Skin: Negative for rash.  Neurological: Negative for dizziness and headaches.  Psychiatric/Behavioral: Negative for depression and substance abuse. The patient is not nervous/anxious.     Past Medical History:  Diagnosis Date  . Bipolar 1 disorder (HCC) 05/13/2011  . Cigarette smoker 05/26/2011   Have asked him to continue consideration of quitting smoking completely.   Marland Kitchen HIV (human immunodeficiency virus infection) (HCC) 05/13/2011  . HIV infection (HCC)   . Rash, skin 05/13/2011   Bilateral arm    . Schizo affective schizophrenia (HCC) 05/13/2011    Social History   Tobacco Use  . Smoking status: Current Every Day Smoker    Packs/day: 1.00    Years: 32.00    Pack years: 32.00    Types: Cigarettes  . Smokeless tobacco: Never Used  . Tobacco comment: going to try to quit this year before 2016  Substance Use Topics  . Alcohol use: Yes    Alcohol/week: 1.2 oz  Types: 2 Standard drinks or equivalent per week    Comment: Occasional  . Drug use: Yes    Frequency: 7.0 times per week    Types: Marijuana    Family History  Problem Relation Age of Onset  . Diabetes Mother   . Hypertension Mother   . Hyperlipidemia Mother   . Stroke Mother   . Diabetes Father   . Hypertension Father   . Hyperlipidemia Father   . Diabetes Sister   . Hypertension Sister   . Hyperlipidemia Sister   . Stroke Sister   . Diabetes Brother   . Hypertension Brother   . Hyperlipidemia Brother   . Cancer Maternal Grandmother     No Known Allergies  Health  Maintenance  Topic Date Due  . COLONOSCOPY  01/03/2012  . INFLUENZA VACCINE  05/10/2017  . TETANUS/TDAP  11/15/2022  . Hepatitis C Screening  Completed  . HIV Screening  Completed    Objective:  Vitals:   11/14/17 1049  BP: 132/85  Temp: 98.2 F (36.8 C)  TempSrc: Oral  Weight: 257 lb (116.6 kg)  Height: 6\' 1"  (1.854 m)   Body mass index is 33.91 kg/m.  Physical Exam  Pulmonary/Chest: Effort normal.    Lab Results Lab Results  Component Value Date   WBC 7.2 02/17/2017   HGB 16.5 02/17/2017   HCT 46.9 02/17/2017   MCV 88.5 02/17/2017   PLT 215.0 02/17/2017    Lab Results  Component Value Date   CREATININE 1.14 02/17/2017   BUN 16 02/17/2017   NA 138 02/17/2017   K 4.6 02/17/2017   CL 106 02/17/2017   CO2 26 02/17/2017    Lab Results  Component Value Date   ALT 8 02/17/2017   AST 14 02/17/2017   ALKPHOS 61 02/17/2017   BILITOT 0.4 02/17/2017    Lab Results  Component Value Date   CHOL 167 02/17/2017   HDL 39.60 02/17/2017   LDLCALC 110 (H) 02/17/2017   TRIG 90.0 02/17/2017   CHOLHDL 4 02/17/2017   Lab Results  Component Value Date   LABRPR NON REAC 10/18/2016   HIV 1 RNA Quant (copies/mL)  Date Value  10/12/2017 25 (H)  10/18/2016 <20  10/27/2015 <20   CD4 T Cell Abs (/uL)  Date Value  10/12/2017 470  10/18/2016 500  10/27/2015 390 (L)     Problem List Items Addressed This Visit      High   HIV (human immunodeficiency virus infection) (HCC)    His infection remains under excellent, long troll.  He will continue his current antiretroviral regimen and follow-up after lab work in 1 year.      Relevant Medications   dolutegravir (TIVICAY) 50 MG tablet   emtricitabine-tenofovir AF (DESCOVY) 200-25 MG tablet   Other Relevant Orders   CBC   T-helper cell (CD4)- ([facility] clinic only)   Comprehensive metabolic panel   Lipid panel   RPR   HIV 1 RNA quant-no reflex-bld        Randall AstersJohn Justyna Timoney, MD [facility] [phone] pager   [phone] cell 11/14/2017, 11:35 AM

## 2017-11-14 NOTE — Assessment & Plan Note (Signed)
His infection remains under excellent, long troll.  He will continue his current antiretroviral regimen and follow-up after lab work in 1 year.

## 2017-12-05 ENCOUNTER — Encounter (HOSPITAL_COMMUNITY): Payer: Self-pay | Admitting: *Deleted

## 2017-12-05 ENCOUNTER — Emergency Department (HOSPITAL_COMMUNITY)
Admission: EM | Admit: 2017-12-05 | Discharge: 2017-12-05 | Disposition: A | Discharge status: Home / Self Care | Payer: Federal, State, Local not specified - PPO | Attending: Emergency Medicine | Admitting: Emergency Medicine

## 2017-12-05 ENCOUNTER — Other Ambulatory Visit: Payer: Self-pay

## 2017-12-05 DIAGNOSIS — R197 Diarrhea, unspecified: Secondary | ICD-10-CM | POA: Diagnosis present

## 2017-12-05 DIAGNOSIS — F17210 Nicotine dependence, cigarettes, uncomplicated: Secondary | ICD-10-CM | POA: Diagnosis not present

## 2017-12-05 DIAGNOSIS — Z79899 Other long term (current) drug therapy: Secondary | ICD-10-CM | POA: Insufficient documentation

## 2017-12-05 LAB — COMPREHENSIVE METABOLIC PANEL
ALK PHOS: 73 U/L (ref 38–126)
ALT: 48 U/L (ref 17–63)
ANION GAP: 10 (ref 5–15)
AST: 39 U/L (ref 15–41)
Albumin: 3.7 g/dL (ref 3.5–5.0)
BILIRUBIN TOTAL: 0.9 mg/dL (ref 0.3–1.2)
BUN: 11 mg/dL (ref 6–20)
CALCIUM: 9 mg/dL (ref 8.9–10.3)
CO2: 22 mmol/L (ref 22–32)
Chloride: 101 mmol/L (ref 101–111)
Creatinine, Ser: 1.17 mg/dL (ref 0.61–1.24)
GFR calc non Af Amer: >60 mL/min (ref >60)
Glucose, Bld: 100 mg/dL — ABNORMAL HIGH (ref 65–99)
Potassium: 4.3 mmol/L (ref 3.5–5.1)
Sodium: 133 mmol/L — ABNORMAL LOW (ref 135–145)
TOTAL PROTEIN: 8 g/dL (ref 6.5–8.1)

## 2017-12-05 LAB — URINALYSIS, ROUTINE W REFLEX MICROSCOPIC
BILIRUBIN URINE: NEGATIVE
Glucose, UA: NEGATIVE mg/dL
Hgb urine dipstick: NEGATIVE
Ketones, ur: NEGATIVE mg/dL
LEUKOCYTES UA: NEGATIVE
NITRITE: NEGATIVE
Protein, ur: NEGATIVE mg/dL
SPECIFIC GRAVITY, URINE: 1.01 (ref 1.005–1.030)
pH: 5 (ref 5.0–8.0)

## 2017-12-05 LAB — CBC
HCT: 46.3 % (ref 39.0–52.0)
HEMOGLOBIN: 17 g/dL (ref 13.0–17.0)
MCH: 30.9 pg (ref 26.0–34.0)
MCHC: 36.7 g/dL — ABNORMAL HIGH (ref 30.0–36.0)
MCV: 84 fL (ref 78.0–100.0)
Platelets: 186 10*3/uL (ref 150–400)
RBC: 5.51 MIL/uL (ref 4.22–5.81)
RDW: 13.7 % (ref 11.5–15.5)
WBC: 6 10*3/uL (ref 4.0–10.5)

## 2017-12-05 MED ORDER — CIPROFLOXACIN HCL 500 MG PO TABS: 500.0000 mg | ORAL_TABLET | Freq: Two times a day (BID) | ORAL | 0 refills | Status: DC

## 2017-12-05 NOTE — ED Triage Notes (Signed)
Pt was in [place] and drank the water there. Reports coming home yesterday and has constant diarrhea, no vomiting since getting home. Denies abd pain

## 2017-12-05 NOTE — ED Provider Notes (Signed)
[facility] EMERGENCY DEPARTMENT Provider Note   CSN: [id number] Arrival date & time: 12/05/17  0035     History   Chief Complaint Chief Complaint  Patient presents with  . Diarrhea    HPI Randall Garrett is a 57 y.o. male with a hx of HIV, bipolar 1 disorder, and tobacco abuse who presents to the ED with diarrhea that started yesterday. Patient states that he was in [place] on vacation and drank the water just prior to onset of diarrhea. Diarrhea is non bloody, without mucous. States he is having some very mild abdominal cramping only with bowel movements, otherwise none, states it is not necessarily abdominal pain. No other specific alleviating/aggravating factors. Has had some chills. Denies fever, nausea, vomiting, dysuria, or testicular pain/swelling.   HPI  Past Medical History:  Diagnosis Date  . Bipolar 1 disorder (HCC) 05/13/2011  . Cigarette smoker 05/26/2011   Have asked him to continue consideration of quitting smoking completely.   Marland Kitchen HIV (human immunodeficiency virus infection) (HCC) 05/13/2011  . HIV infection (HCC)   . Rash, skin 05/13/2011   Bilateral arm    . Schizo affective schizophrenia (HCC) 05/13/2011    Patient Active Problem List   Diagnosis Date Noted  . Routine general medical examination at a health care facility 06/23/2015  . Dyslipidemia 01/08/2014  . Cigarette smoker 05/26/2011  . Bipolar 1 disorder (HCC) 05/13/2011  . HIV (human immunodeficiency virus infection) (HCC) 05/13/2011  . Schizo affective schizophrenia (HCC) 05/13/2011  . Rash, skin 05/13/2011    Past Surgical History:  Procedure Laterality Date  . Broke Left arm  1991       Home Medications    Prior to Admission medications   Medication Sig Start Date End Date Taking? Authorizing Provider  dolutegravir (TIVICAY) 50 MG tablet Take 1 tablet (50 mg total) by mouth daily. 11/14/17   Cliffton Asters, MD  emtricitabine-tenofovir AF (DESCOVY) 200-25 MG tablet Take 1  tablet by mouth daily. 11/14/17   Cliffton Asters, MD  OLANZapine (ZYPREXA) 20 MG tablet Take 2 tablets (40 mg total) by mouth at bedtime. 05/09/13   Cliffton Asters, MD  pravastatin (PRAVACHOL) 20 MG tablet Take 1 tablet (20 mg total) by mouth daily. Patient not taking: Reported on 11/14/2017 02/23/17   Veryl Speak, FNP  sildenafil (VIAGRA) 100 MG tablet Take 0.5-1 tablets (50-100 mg total) by mouth daily as needed for erectile dysfunction. Patient not taking: Reported on 11/14/2017 02/15/17   Veryl Speak, FNP  triamcinolone cream (KENALOG) 0.1 % Apply 1 application topically 2 (two) times daily. 03/13/12   Cliffton Asters, MD    Family History Family History  Problem Relation Age of Onset  . Diabetes Mother   . Hypertension Mother   . Hyperlipidemia Mother   . Stroke Mother   . Diabetes Father   . Hypertension Father   . Hyperlipidemia Father   . Diabetes Sister   . Hypertension Sister   . Hyperlipidemia Sister   . Stroke Sister   . Diabetes Brother   . Hypertension Brother   . Hyperlipidemia Brother   . Cancer Maternal Grandmother     Social History Social History   Tobacco Use  . Smoking status: Current Every Day Smoker    Packs/day: 1.00    Years: 32.00    Pack years: 32.00    Types: Cigarettes  . Smokeless tobacco: Never Used  . Tobacco comment: going to try to quit this year before 2016  Substance  Use Topics  . Alcohol use: Yes    Alcohol/week: 1.2 oz    Types: 2 Standard drinks or equivalent per week    Comment: Occasional  . Drug use: No     Allergies   Patient has no known allergies.   Review of Systems Review of Systems  Constitutional: Positive for chills. Negative for fever.  Respiratory: Negative for shortness of breath.   Cardiovascular: Negative for chest pain.  Gastrointestinal: Positive for diarrhea. Negative for blood in stool, constipation, nausea and vomiting.       Positive for abdominal cramping only with bowel movements.     Genitourinary: Negative for dysuria, scrotal swelling and testicular pain.  All other systems reviewed and are negative.    Physical Exam Updated Vital Signs BP 118/70 (BP Location: Right Arm)   Pulse 77   Temp 98 F (36.7 C) (Oral)   Resp 16   SpO2 98%   Physical Exam  Constitutional: He appears well-developed and well-nourished. No distress.  HENT:  Head: Normocephalic and atraumatic.  Eyes: Conjunctivae are normal. Right eye exhibits no discharge. Left eye exhibits no discharge.  Cardiovascular: Normal rate and regular rhythm.  No murmur heard. Pulmonary/Chest: Breath sounds normal. No respiratory distress. He has no wheezes. He has no rales.  Abdominal: Soft. He exhibits no distension. There is no tenderness. There is no rigidity, no rebound, no guarding, no CVA tenderness, no tenderness at McBurney's point and negative Murphy's sign.  Neurological: He is alert.  Clear speech.   Skin: Skin is warm and dry. No rash noted.  Psychiatric: He has a normal mood and affect. His behavior is normal.  Nursing note and vitals reviewed.   ED Treatments / Results  Labs Results for orders placed or performed during the hospital encounter of 12/05/17  Comprehensive metabolic panel  Result Value Ref Range   Sodium 133 (L) 135 - 145 mmol/L   Potassium 4.3 3.5 - 5.1 mmol/L   Chloride 101 101 - 111 mmol/L   CO2 22 22 - 32 mmol/L   Glucose, Bld 100 (H) 65 - 99 mg/dL   BUN 11 6 - 20 mg/dL   Creatinine, Ser 1.61 0.61 - 1.24 mg/dL   Calcium 9.0 8.9 - 09.6 mg/dL   Total Protein 8.0 6.5 - 8.1 g/dL   Albumin 3.7 3.5 - 5.0 g/dL   AST 39 15 - 41 U/L   ALT 48 17 - 63 U/L   Alkaline Phosphatase 73 38 - 126 U/L   Total Bilirubin 0.9 0.3 - 1.2 mg/dL   GFR calc non Af Amer >60 >60 mL/min   GFR calc Af Amer >60 >60 mL/min   Anion gap 10 5 - 15  CBC  Result Value Ref Range   WBC 6.0 4.0 - 10.5 K/uL   RBC 5.51 4.22 - 5.81 MIL/uL   Hemoglobin 17.0 13.0 - 17.0 g/dL   HCT 04.5 40.9 - 81.1 %    MCV 84.0 78.0 - 100.0 fL   MCH 30.9 26.0 - 34.0 pg   MCHC 36.7 (H) 30.0 - 36.0 g/dL   RDW 91.4 78.2 - 95.6 %   Platelets 186 150 - 400 K/uL   No results found. EKG  EKG Interpretation None      Radiology No results found.  Procedures Procedures (including critical care time)  Medications Ordered in ED Medications - No data to display   Initial Impression / Assessment and Plan / ED Course  I have reviewed the triage vital  signs and the nursing notes.  Pertinent labs & imaging results that were available during my care of the patient were reviewed by me and considered in my medical decision making (see chart for details).   Patient presents with complaint of diarrhea after consuming water on trip to [place]. Patient is nontoxic appearing, in no apparent distress, initially mildly hypertensive, all vitals normalized prior to DC. On exam patient's abdomen is completely non tender, there are no peritoneal signs. Patient does not meet the SIRS or Sepsis criteria. No indication of appendicitis, bowel obstruction, bowel perforation, cholecystitis, or diverticulitis. Labs reviewed, grossly unremarkable, Sodium 133, no significant electrolyte abnormalities, no leukocytosis, no significant abnormalities with liver or renal function, no signs of infection on UA. Discussed with supervising physician Dr. Blinda LeatherwoodPollina, recommends Cipro. Will DC patient with prescription for Ciprofloxacin for potential infectious etiology given likely traveler's diarrhea. I discussed results, treatment plan, need for PCP follow-up, and return precautions with the patient. Provided opportunity for questions, patient confirmed understanding and is in agreement with plan.   Final Clinical Impressions(s) / ED Diagnoses   Final diagnoses:  Diarrhea of presumed infectious origin    ED Discharge Orders        Ordered    ciprofloxacin (CIPRO) 500 MG tablet  2 times daily     12/05/17 0619       Cherly Andersonetrucelli, Alazay Leicht  R, PA-C 12/05/17 [id number]    Gilda CreasePollina, Christopher J, MD 12/05/17 [phone]

## 2017-12-05 NOTE — Discharge Instructions (Signed)
You were seen in the emergency department today for diarrhea. Your lab work did not show any significant abnormalities. We are treating you with an antibiotic, Ciprofloxacin, to treat the potential infection after being in [place]. Please take all of your antibiotics until finished. Antibiotics can sometimes worsen your diarrhea initially while treating the infection, you may help offset this with probiotics which you can buy at the store (ask your pharmacist if unable to find) or get probiotics in the form of eating yogurt. Do not eat or take the probiotics until 2 hours after your antibiotic. If you are unable to tolerate these side effects follow-up with your primary care provider or return to the emergency department.   If you begin to experience any blistering, rashes, swelling, or difficulty breathing seek medical care for evaluation of potentially more serious side effects.   Please be aware that this medication may interact with other medications you are taking, please be sure to discuss your medication list with your pharmacist.    Follow up with your primary care doctor for re-evaluation in the next 3-5 days for re-evaluation. Return to the emergency department for new or worsening symptoms.

## 2017-12-05 NOTE — ED Notes (Signed)
Patient verbalized understanding; did not want to e-sign.

## 2017-12-06 ENCOUNTER — Ambulatory Visit: Payer: Self-pay | Admitting: *Deleted

## 2017-12-06 NOTE — Telephone Encounter (Signed)
Called in c/o having diarrhea since visiting [place] and [place] last week.   He returned over the weekend.   The diarrhea began after he arrived home.   Having watery, cloudy diarrhea.    Went to the ED Monday night.   They started him on an antibiotic, he thinks it is.   He started taking it yesterday.   He is having chills, sweating and diarrhea after everything he eats.  I made him an appt with Dr. Clare GandyJeremy Schmitz for 12/07/17 at 1:20 for the diarrhea after talking with the flow coordinator.   (She is a former Marcos EkeGreg Calone, NP pt that needs to transfer care).  I then scheduled him with Dr. Oliver BarreJames John to establish care and to discuss rt arm pain ongoing for 6 months and erectile dysfunction for 12/29/17 at 1:20PM.    Reason for Disposition . Travel to a foreign country in past month  Answer Assessment - Initial Assessment Questions 1. DIARRHEA SEVERITY: "How bad is the diarrhea?" "How many extra stools have you had in the past 24 hours than normal?"    - MILD: Few loose or mushy BMs; increase of 1-3 stools over normal daily number of stools; mild increase in ostomy output.   - MODERATE: Increase of 4-6 stools daily over normal; moderate increase in ostomy output.   - SEVERE (or Worst Possible): Increase of 7 or more stools daily over normal; moderate increase in ostomy output; incontinence.     Had 4-5 episodes of diarrhea today.    Every time I eat it comes right back out.   I went to [place] and [place] and got back Saturday night.   I've been having diarrhea since then.   Went to the ED Monday night.   They put me on medicine.   I've taken the dose yesterday.   Still having diarrhea. 2. ONSET: "When did the diarrhea begin?"      The diarrhea started after I got back from my trip. 3. BM CONSISTENCY: "How loose or watery is the diarrhea?"      It's very watery and cloudy.   When I wake up it's normal the first BM then after that it's watery and cloudy. 4. VOMITING: "Are you also vomiting?" If so, ask:  "How many times in the past 24 hours?"      No vomiting 5. ABDOMINAL PAIN: "Are you having any abdominal pain?" If yes: "What does it feel like?" (e.g., crampy, dull, intermittent, constant)      No abd pain 6. ABDOMINAL PAIN SEVERITY: If present, ask: "How bad is the pain?"  (e.g., Scale 1-10; mild, moderate, or severe)    - MILD (1-3): doesn't interfere with normal activities, abdomen soft and not tender to touch     - MODERATE (4-7): interferes with normal activities or awakens from sleep, tender to touch     - SEVERE (8-10): excruciating pain, doubled over, unable to do any normal activities       No pain.   Every time I eat it comes out as cloudy water.  7. ORAL INTAKE: If vomiting, "Have you been able to drink liquids?" "How much fluids have you had in the past 24 hours?"     The last 2 days I've just been drinking water, coffee and soup and some meat and fruit like apples.   Also cereal. 8. HYDRATION: "Any signs of dehydration?" (e.g., dry mouth [not just dry lips], too weak to stand, dizziness, new weight loss) "When did  you last urinate?"     No dizziness.   Urination is normal.   I urinate every time I have the diarrhea. 9. EXPOSURE: "Have you traveled to a foreign country recently?" "Have you been exposed to anyone with diarrhea?" "Could you have eaten any food that was spoiled?"     Yes   [place] and [place]. 10. OTHER SYMPTOMS: "Do you have any other symptoms?" (e.g., fever, blood in stool)       No blood 11. PREGNANCY: "Is there any chance you are pregnant?" "When was your last menstrual period?"       N/A  Protocols used: DIARRHEA-A-AH

## 2017-12-07 ENCOUNTER — Ambulatory Visit (INDEPENDENT_AMBULATORY_CARE_PROVIDER_SITE_OTHER): Payer: Federal, State, Local not specified - PPO | Admitting: Family Medicine

## 2017-12-07 ENCOUNTER — Ambulatory Visit: Payer: Federal, State, Local not specified - PPO | Admitting: Internal Medicine

## 2017-12-07 ENCOUNTER — Encounter: Payer: Self-pay | Admitting: Family Medicine

## 2017-12-07 VITALS — BP 138/76 | HR 74 | Temp 97.9°F | Ht 73.0 in | Wt 257.0 lb

## 2017-12-07 DIAGNOSIS — R197 Diarrhea, unspecified: Secondary | ICD-10-CM

## 2017-12-07 DIAGNOSIS — G8929 Other chronic pain: Secondary | ICD-10-CM

## 2017-12-07 DIAGNOSIS — M25511 Pain in right shoulder: Secondary | ICD-10-CM

## 2017-12-07 DIAGNOSIS — N521 Erectile dysfunction due to diseases classified elsewhere: Secondary | ICD-10-CM

## 2017-12-07 MED ORDER — DICLOFENAC SODIUM 2 % TD SOLN: 1.0000 "application " | Freq: Two times a day (BID) | TRANSDERMAL | 3 refills | Status: DC

## 2017-12-07 NOTE — Progress Notes (Signed)
Randall Garrett - 57 y.o. male MRN [id number]  Date of birth: [birth year]  SUBJECTIVE:  Including CC & ROS.  Chief Complaint  Patient presents with  . Diarrhea    Randall Garrett is a 57 y.o. male that is presenting with diarrhea. Ongoing for five days. He went to [place] and [place]. He did drink the water in [place]. Denies fevers, chills and body aches. He went to the ER 12/05/17, prescribed Cirpo with some improvement. Admits to lower abdominal pain. Denies nausea or vomiting. Denies blood in his stool . He has been having about 4 stools a day. The stools occur after he eats.  Having right shoulder pain for the past 2 months. This is localized to the shoulder. The pain is worse with abduction. The pain is mild to moderate in nature. There is no inciting event. He denies any prior surgery on that shoulder. The pain is ongoing. He has not tried any modalities. The pain is sharp in nature.  Has a history of erectile dysfunction. He has tried sildenafil and Cialis with little to no improvement. He would like to try different options for therapy.   Review of Systems  Constitutional: Negative for fever.  HENT: Negative for congestion.   Respiratory: Negative for cough.   Cardiovascular: Negative for chest pain.  Gastrointestinal: Positive for diarrhea. Negative for abdominal pain.  Musculoskeletal: Negative for gait problem.  Skin: Negative for color change.  Allergic/Immunologic: Positive for immunocompromised state.  Neurological: Negative for weakness.  Hematological: Negative for adenopathy.  Psychiatric/Behavioral: Negative for agitation.    HISTORY: Past Medical, Surgical, Social, and Family History Reviewed & Updated per EMR.   Pertinent Historical Findings include:  Past Medical History:  Diagnosis Date  . Bipolar 1 disorder (HCC) 05/13/2011  . Cigarette smoker 05/26/2011   Have asked him to continue consideration of quitting smoking completely.   Marland Kitchen HIV (human immunodeficiency virus  infection) (HCC) 05/13/2011  . HIV infection (HCC)   . Rash, skin 05/13/2011   Bilateral arm    . Schizo affective schizophrenia (HCC) 05/13/2011    Past Surgical History:  Procedure Laterality Date  . Broke Left arm  1991    No Known Allergies  Family History  Problem Relation Age of Onset  . Diabetes Mother   . Hypertension Mother   . Hyperlipidemia Mother   . Stroke Mother   . Diabetes Father   . Hypertension Father   . Hyperlipidemia Father   . Diabetes Sister   . Hypertension Sister   . Hyperlipidemia Sister   . Stroke Sister   . Diabetes Brother   . Hypertension Brother   . Hyperlipidemia Brother   . Cancer Maternal Grandmother      Social History   Socioeconomic History  . Marital status: Single    Spouse name: Not on file  . Number of children: 0  . Years of education: 56  . Highest education level: Not on file  Social Needs  . Financial resource strain: Not on file  . Food insecurity - worry: Not on file  . Food insecurity - inability: Not on file  . Transportation needs - medical: Not on file  . Transportation needs - non-medical: Not on file  Occupational History  . Occupation: Retired   Tobacco Use  . Smoking status: Current Every Day Smoker    Packs/day: 1.00    Years: 32.00    Pack years: 32.00    Types: Cigarettes  . Smokeless tobacco: Never Used  .  Tobacco comment: going to try to quit this year before 2016  Substance and Sexual Activity  . Alcohol use: Yes    Alcohol/week: 1.2 oz    Types: 2 Standard drinks or equivalent per week    Comment: Occasional  . Drug use: No  . Sexual activity: No    Comment: declined condoms  Other Topics Concern  . Not on file  Social History Narrative   Fun: Exercise and outdoor activity.    Denies religious beliefs effecting health care.      PHYSICAL EXAM:  VS: BP 138/76 (BP Location: Left Arm, Patient Position: Sitting, Cuff Size: Normal)   Pulse 74   Temp 97.9 F (36.6 C) (Oral)   Ht 6\' 1"   (1.854 m)   Wt 257 lb (116.6 kg)   SpO2 97%   BMI 33.91 kg/m  Physical Exam Gen: NAD, alert, cooperative with exam, well-appearing ENT: normal lips, normal nasal mucosa,  Eye: normal EOM, normal conjunctiva and lids CV:  no edema, +2 pedal pulses   Resp: no accessory muscle use, non-labored,  GI: no masses or tenderness, no hernia , soft, nondistended, positive bowel sounds Skin: no rashes, no areas of induration  Neuro: normal tone, normal sensation to touch Psych:  normal insight, alert and oriented MSK:  Right shoulder: Normal shoulder range of motion Normal external and internal rotation strength resistance. Negative empty can testing. Positive Hawkins testing. Negative speed's test. Normal grip strength. Neurovascularly intact.  Limited ultrasound: Right shoulder:  Normal-appearing biceps tendon. Normal subscapularis. Small subacromial bursa is present as well as a cyst just superficial to the supraspinatus  Summary: Subacromial bursitis   Ultrasound and interpretation by Clare GandyJeremy Schmitz, MD             ASSESSMENT & PLAN:   Erectile dysfunction due to diseases classified elsewhere He has tried medications and would like to try different options  - Referral to urology  Diarrhea He was seen in the emergency department on 2/26 and thought this is to be traveler's diarrhea. Has been started on Cipro. He does have a history of HIV that is well-controlled. - Gi path panel today  - continue cipro  - immodium - if no improvement would obtain stool culture.   Chronic right shoulder pain Suggestions of subacromial bursitis. Has a cyst on US but doesn't appear to be impinging  - Pennsaid - counseled on HEP  - if no improvement then would try injection

## 2017-12-07 NOTE — Patient Instructions (Signed)
Please try the exercises I have provided for you We will call you with the results once they are obtained.  I have made a referral to urology and they will call you .

## 2017-12-08 DIAGNOSIS — M25511 Pain in right shoulder: Secondary | ICD-10-CM

## 2017-12-08 DIAGNOSIS — N521 Erectile dysfunction due to diseases classified elsewhere: Secondary | ICD-10-CM | POA: Insufficient documentation

## 2017-12-08 DIAGNOSIS — G8929 Other chronic pain: Secondary | ICD-10-CM | POA: Insufficient documentation

## 2017-12-08 DIAGNOSIS — R197 Diarrhea, unspecified: Secondary | ICD-10-CM | POA: Insufficient documentation

## 2017-12-08 NOTE — Assessment & Plan Note (Signed)
He has tried medications and would like to try different options  - Referral to urology

## 2017-12-08 NOTE — Assessment & Plan Note (Signed)
He was seen in the emergency department on 2/26 and thought this is to be traveler's diarrhea. Has been started on Cipro. He does have a history of HIV that is well-controlled. - Gi path panel today  - continue cipro  - immodium - if no improvement would obtain stool culture.

## 2017-12-08 NOTE — Assessment & Plan Note (Signed)
Suggestions of subacromial bursitis. Has a cyst on US but doesn't appear to be impinging  - Pennsaid - counseled on HEP  - if no improvement then would try injection

## 2017-12-29 ENCOUNTER — Ambulatory Visit: Payer: Federal, State, Local not specified - PPO | Admitting: Internal Medicine

## 2018-03-20 ENCOUNTER — Encounter: Payer: Self-pay | Admitting: Family Medicine

## 2018-09-13 ENCOUNTER — Other Ambulatory Visit: Payer: Self-pay | Admitting: *Deleted

## 2018-09-13 DIAGNOSIS — B20 Human immunodeficiency virus [HIV] disease: Secondary | ICD-10-CM

## 2018-09-13 MED ORDER — DOLUTEGRAVIR SODIUM 50 MG PO TABS: 50.0000 mg | ORAL_TABLET | Freq: Every day | ORAL | 5 refills | Status: DC

## 2018-09-13 MED ORDER — EMTRICITABINE-TENOFOVIR AF 200-25 MG PO TABS: 1.0000 | ORAL_TABLET | Freq: Every day | ORAL | 5 refills | Status: DC

## 2018-10-31 ENCOUNTER — Other Ambulatory Visit: Payer: Federal, State, Local not specified - PPO

## 2018-11-14 ENCOUNTER — Encounter: Payer: Federal, State, Local not specified - PPO | Admitting: Internal Medicine

## 2019-03-26 ENCOUNTER — Telehealth: Payer: Self-pay

## 2019-03-26 ENCOUNTER — Other Ambulatory Visit: Payer: Self-pay | Admitting: Internal Medicine

## 2019-03-26 DIAGNOSIS — B20 Human immunodeficiency virus [HIV] disease: Secondary | ICD-10-CM

## 2019-03-26 NOTE — Telephone Encounter (Signed)
Attempted to call patient to schedule over due appointment with our office. Patient was last seen on 11/2017. Patient will need to schedule lab appointment and office visit two weeks after. Left voicemail requesting a call back. [place]

## 2019-04-01 ENCOUNTER — Other Ambulatory Visit: Payer: Self-pay | Admitting: Internal Medicine

## 2019-04-01 ENCOUNTER — Other Ambulatory Visit: Payer: Self-pay

## 2019-04-01 ENCOUNTER — Telehealth: Payer: Self-pay | Admitting: *Deleted

## 2019-04-01 DIAGNOSIS — B20 Human immunodeficiency virus [HIV] disease: Secondary | ICD-10-CM

## 2019-04-01 DIAGNOSIS — R21 Rash and other nonspecific skin eruption: Secondary | ICD-10-CM

## 2019-04-01 MED ORDER — TRIAMCINOLONE ACETONIDE 0.1 % EX CREA: 1.0000 "application " | TOPICAL_CREAM | Freq: Two times a day (BID) | CUTANEOUS | 0 refills | Status: DC

## 2019-04-01 NOTE — Telephone Encounter (Signed)
-----   Message from Lynnell Chad sent at 04/01/2019  3:26 PM EDT ----- Regarding: Medication Refill Patient called and left voicemail requesting a medication refill. Call back number [phone]

## 2019-04-01 NOTE — Telephone Encounter (Signed)
RN returned call, left message asking Joevanni to call back if he still needed assistance. Refills were sent earlier today for Tivicay/Descovy/kenalog cream to [facility] on [address]. Landis Gandy, RN

## 2019-04-17 ENCOUNTER — Other Ambulatory Visit: Payer: Self-pay | Admitting: *Deleted

## 2019-04-17 ENCOUNTER — Other Ambulatory Visit: Payer: Self-pay

## 2019-04-17 ENCOUNTER — Other Ambulatory Visit: Payer: Federal, State, Local not specified - PPO

## 2019-04-17 DIAGNOSIS — Z113 Encounter for screening for infections with a predominantly sexual mode of transmission: Secondary | ICD-10-CM

## 2019-04-17 DIAGNOSIS — B20 Human immunodeficiency virus [HIV] disease: Secondary | ICD-10-CM

## 2019-04-17 DIAGNOSIS — Z21 Asymptomatic human immunodeficiency virus [HIV] infection status: Secondary | ICD-10-CM

## 2019-04-17 MED ORDER — TIVICAY 50 MG PO TABS: 50.0000 mg | ORAL_TABLET | Freq: Every day | ORAL | 0 refills | Status: DC

## 2019-04-17 MED ORDER — DESCOVY 200-25 MG PO TABS: 1.0000 | ORAL_TABLET | Freq: Every day | ORAL | 0 refills | Status: DC

## 2019-04-18 LAB — T-HELPER CELL (CD4) - (RCID CLINIC ONLY)
CD4 % Helper T Cell: 13 % — ABNORMAL LOW (ref 33–65)
CD4 T Cell Abs: 405 /uL (ref 400–1790)

## 2019-04-24 LAB — COMPLETE METABOLIC PANEL WITH GFR
AG Ratio: 1.4 (calc) (ref 1.0–2.5)
ALT: 15 U/L (ref 9–46)
AST: 19 U/L (ref 10–35)
Albumin: 4.1 g/dL (ref 3.6–5.1)
Alkaline phosphatase (APISO): 71 U/L (ref 35–144)
BUN: 14 mg/dL (ref 7–25)
CO2: 26 mmol/L (ref 20–32)
Calcium: 9.5 mg/dL (ref 8.6–10.3)
Chloride: 104 mmol/L (ref 98–110)
Creat: 1.18 mg/dL (ref 0.70–1.33)
GFR, Est African American: 79 mL/min/{1.73_m2} (ref >=60)
GFR, Est Non African American: 68 mL/min/{1.73_m2} (ref >=60)
Globulin: 2.9 g/dL (calc) (ref 1.9–3.7)
Glucose, Bld: 94 mg/dL (ref 65–99)
Potassium: 4.6 mmol/L (ref 3.5–5.3)
Sodium: 136 mmol/L (ref 135–146)
Total Bilirubin: 0.4 mg/dL (ref 0.2–1.2)
Total Protein: 7 g/dL (ref 6.1–8.1)

## 2019-04-24 LAB — CBC WITH DIFFERENTIAL/PLATELET
Absolute Monocytes: 907 cells/uL (ref 200–950)
Basophils Absolute: 67 cells/uL (ref 0–200)
Basophils Relative: 0.8 %
Eosinophils Absolute: 143 cells/uL (ref 15–500)
Eosinophils Relative: 1.7 %
HCT: 46.6 % (ref 38.5–50.0)
Hemoglobin: 16.2 g/dL (ref 13.2–17.1)
Lymphs Abs: 3251 cells/uL (ref 850–3900)
MCH: 30.6 pg (ref 27.0–33.0)
MCHC: 34.8 g/dL (ref 32.0–36.0)
MCV: 87.9 fL (ref 80.0–100.0)
MPV: 10.4 fL (ref 7.5–12.5)
Monocytes Relative: 10.8 %
Neutro Abs: 4032 cells/uL (ref 1500–7800)
Neutrophils Relative %: 48 %
Platelets: 222 10*3/uL (ref 140–400)
RBC: 5.3 10*6/uL (ref 4.20–5.80)
RDW: 14.9 % (ref 11.0–15.0)
Total Lymphocyte: 38.7 %
WBC: 8.4 10*3/uL (ref 3.8–10.8)

## 2019-04-24 LAB — HIV-1 RNA QUANT-NO REFLEX-BLD
HIV 1 RNA Quant: <20 copies/mL
HIV-1 RNA Quant, Log: <1.3 Log copies/mL

## 2019-04-24 LAB — RPR: RPR Ser Ql: NONREACTIVE

## 2019-04-25 ENCOUNTER — Other Ambulatory Visit: Payer: Self-pay | Admitting: Internal Medicine

## 2019-04-25 DIAGNOSIS — Z21 Asymptomatic human immunodeficiency virus [HIV] infection status: Secondary | ICD-10-CM

## 2019-04-25 DIAGNOSIS — B20 Human immunodeficiency virus [HIV] disease: Secondary | ICD-10-CM

## 2019-05-01 ENCOUNTER — Ambulatory Visit (INDEPENDENT_AMBULATORY_CARE_PROVIDER_SITE_OTHER): Payer: Federal, State, Local not specified - PPO | Admitting: Internal Medicine

## 2019-05-01 ENCOUNTER — Other Ambulatory Visit: Payer: Self-pay

## 2019-05-01 ENCOUNTER — Encounter: Payer: Self-pay | Admitting: Internal Medicine

## 2019-05-01 DIAGNOSIS — E669 Obesity, unspecified: Secondary | ICD-10-CM | POA: Insufficient documentation

## 2019-05-01 DIAGNOSIS — F319 Bipolar disorder, unspecified: Secondary | ICD-10-CM | POA: Diagnosis not present

## 2019-05-01 DIAGNOSIS — F17210 Nicotine dependence, cigarettes, uncomplicated: Secondary | ICD-10-CM

## 2019-05-01 DIAGNOSIS — Z21 Asymptomatic human immunodeficiency virus [HIV] infection status: Secondary | ICD-10-CM | POA: Diagnosis not present

## 2019-05-01 NOTE — Assessment & Plan Note (Signed)
We will help him get a new PCP. 

## 2019-05-01 NOTE — Assessment & Plan Note (Signed)
His infection remains under excellent, long-term control.  I told him that it is much less likely that he could transmit HIV infection when he is undetectable but that I did not like to tell people it was impossible.  I suggested that he should have a discussion with our about the risk and benefits of condom use and PrEP for viral if they do become sexually active.  Randall Garrett will continue his current antiretroviral regimen and follow-up after lab work in 1 year.

## 2019-05-01 NOTE — Assessment & Plan Note (Signed)
I encouraged him to consider setting a quit date and doing his best to stop smoking.

## 2019-05-01 NOTE — Progress Notes (Signed)
Patient Active Problem List   Diagnosis Date Noted  . HIV (human immunodeficiency virus infection) ([place]) 05/13/2011    Priority: High  . Schizo affective schizophrenia ([place]) 05/13/2011    Priority: High  . Obesity 05/01/2019  . Chronic right shoulder pain 12/08/2017  . Erectile dysfunction due to diseases classified elsewhere 12/08/2017  . Routine general medical examination at a health care facility 06/23/2015  . Dyslipidemia 01/08/2014  . Cigarette smoker 05/26/2011  . Bipolar 1 disorder ([place]) 05/13/2011    Patient's Medications  New Prescriptions   No medications on file  Previous Medications   CIPROFLOXACIN (CIPRO) 500 MG TABLET    Take 1 tablet (500 mg total) by mouth 2 (two) times daily.   DICLOFENAC SODIUM (PENNSAID) 2 % SOLN    Place 1 application onto the skin 2 (two) times daily.   DOLUTEGRAVIR (TIVICAY) 50 MG TABLET    Take 1 tablet (50 mg total) by mouth daily.   EMTRICITABINE-TENOFOVIR AF (DESCOVY) 200-25 MG TABLET    Take 1 tablet by mouth daily.   OLANZAPINE (ZYPREXA) 20 MG TABLET    Take 2 tablets (40 mg total) by mouth at bedtime.   TRIAMCINOLONE CREAM (KENALOG) 0.1 %    Apply 1 application topically 2 (two) times daily.  Modified Medications   No medications on file  Discontinued Medications   No medications on file    Subjective: Peighton is in for his first visit since February of last year.  Fortunately he has had no problems obtaining, taking or tolerating his Descovy or Tivicay.  He does not recall missing any doses.  Has been laying low during the [place] pandemic.  He is still smoking cigarettes and has no current plan to quit.  He does not have a current PCP.  He has been seeing a new partner, Jeanne Ivan, who is HIV negative.  He says they are considering getting married.  Review of Systems: Review of Systems  Constitutional: Negative for chills, diaphoresis and fever.  Respiratory: Negative for cough, sputum production and shortness of breath.    Cardiovascular: Negative for chest pain.  Gastrointestinal: Negative for abdominal pain, diarrhea, nausea and vomiting.  Genitourinary: Negative for dysuria.  Psychiatric/Behavioral: Negative for depression and substance abuse.    Past Medical History:  Diagnosis Date  . Bipolar 1 disorder ([place]) 05/13/2011  . Cigarette smoker 05/26/2011   Have asked him to continue consideration of quitting smoking completely.   Marland Kitchen HIV (human immunodeficiency virus infection) ([place]) 05/13/2011  . HIV infection ([place])   . Rash, skin 05/13/2011   Bilateral arm    . Schizo affective schizophrenia ([place]) 05/13/2011    Social History   Tobacco Use  . Smoking status: Current Every Day Smoker    Packs/day: 1.00    Years: 32.00    Pack years: 32.00    Types: Cigarettes  . Smokeless tobacco: Never Used  . Tobacco comment: going to try to quit this year before 2016  Substance Use Topics  . Alcohol use: Yes    Alcohol/week: 2.0 standard drinks    Types: 2 Standard drinks or equivalent per week    Comment: Occasional  . Drug use: No    Frequency: 7.0 times per week    Types: Marijuana    Family History  Problem Relation Age of Onset  . Diabetes Mother   . Hypertension Mother   . Hyperlipidemia Mother   . Stroke Mother   . Diabetes Father   .  Hypertension Father   . Hyperlipidemia Father   . Diabetes Sister   . Hypertension Sister   . Hyperlipidemia Sister   . Stroke Sister   . Diabetes Brother   . Hypertension Brother   . Hyperlipidemia Brother   . Cancer Maternal Grandmother     No Known Allergies  Health Maintenance  Topic Date Due  . COLONOSCOPY  01/03/2012  . INFLUENZA VACCINE  05/11/2019  . TETANUS/TDAP  11/15/2022  . Hepatitis C Screening  Completed  . HIV Screening  Completed    Objective:  Vitals:   05/01/19 1521  BP: 138/87  Pulse: 69  Temp: 97.8 F (36.6 C)  Weight: 254 lb (115.2 kg)  Height: 6' (1.829 m)   Body mass index is 34.45 kg/m.  Physical Exam  Constitutional:      Comments: He is talkative and in good spirits as usual.  Cardiovascular:     Rate and Rhythm: Normal rate and regular rhythm.     Heart sounds: No murmur.  Pulmonary:     Effort: Pulmonary effort is normal.     Breath sounds: Normal breath sounds.  Abdominal:     Palpations: Abdomen is soft.     Tenderness: There is no abdominal tenderness.  Psychiatric:        Mood and Affect: Mood normal.     Lab Results Lab Results  Component Value Date   WBC 8.4 04/17/2019   HGB 16.2 04/17/2019   HCT 46.6 04/17/2019   MCV 87.9 04/17/2019   PLT 222 04/17/2019    Lab Results  Component Value Date   CREATININE 1.18 04/17/2019   BUN 14 04/17/2019   NA 136 04/17/2019   K 4.6 04/17/2019   CL 104 04/17/2019   CO2 26 04/17/2019    Lab Results  Component Value Date   ALT 15 04/17/2019   AST 19 04/17/2019   ALKPHOS 73 12/05/2017   BILITOT 0.4 04/17/2019    Lab Results  Component Value Date   CHOL 167 02/17/2017   HDL 39.60 02/17/2017   LDLCALC 110 (H) 02/17/2017   TRIG 90.0 02/17/2017   CHOLHDL 4 02/17/2017   Lab Results  Component Value Date   LABRPR NON-REACTIVE 04/17/2019   HIV 1 RNA Quant (copies/mL)  Date Value  04/17/2019 <20 NOT DETECTED  10/12/2017 25 (H)  10/18/2016 <20   CD4 T Cell Abs (/uL)  Date Value  04/17/2019 405  10/12/2017 470  10/18/2016 500     Problem List Items Addressed This Visit      High   HIV (human immunodeficiency virus infection) (HCC)    His infection remains under excellent, long-term control.  I told him that it is much less likely that he could transmit HIV infection when he is undetectable but that I did not like to tell people it was impossible.  I suggested that he should have a discussion with our about the risk and benefits of condom use and PrEP for viral if they do become sexually active.  [name] will continue his current antiretroviral regimen and follow-up after lab work in 1 year.      Relevant Orders    CBC   T-helper cell (CD4)- ([facility] clinic only)   Comprehensive metabolic panel   Lipid panel   RPR   HIV-1 RNA quant-no reflex-bld     Unprioritized   Obesity    We will help him get a new PCP.      Cigarette smoker    I  encouraged him to consider setting a quit date and doing his best to stop smoking.      Bipolar 1 disorder (HCC)    His depression is in remission.           Cliffton AstersJohn Aristidis Talerico, MD [facility] [phone] pager   [phone] cell 05/01/2019, 3:48 PM

## 2019-05-01 NOTE — Assessment & Plan Note (Signed)
His depression is in remission. 

## 2019-05-15 ENCOUNTER — Other Ambulatory Visit: Payer: Self-pay | Admitting: *Deleted

## 2019-05-15 DIAGNOSIS — B20 Human immunodeficiency virus [HIV] disease: Secondary | ICD-10-CM

## 2019-05-15 MED ORDER — TIVICAY 50 MG PO TABS: 50.0000 mg | ORAL_TABLET | Freq: Every day | ORAL | 5 refills | Status: DC

## 2019-05-15 MED ORDER — DESCOVY 200-25 MG PO TABS: 1.0000 | ORAL_TABLET | Freq: Every day | ORAL | 5 refills | Status: DC

## 2019-09-18 ENCOUNTER — Other Ambulatory Visit: Payer: Self-pay

## 2019-09-19 ENCOUNTER — Encounter: Payer: Self-pay | Admitting: Family Medicine

## 2019-09-19 ENCOUNTER — Ambulatory Visit (INDEPENDENT_AMBULATORY_CARE_PROVIDER_SITE_OTHER): Payer: Federal, State, Local not specified - PPO | Admitting: Family Medicine

## 2019-09-19 VITALS — BP 122/62 | HR 97 | Temp 98.2°F | Ht 72.0 in | Wt 248.6 lb

## 2019-09-19 DIAGNOSIS — Z7689 Persons encountering health services in other specified circumstances: Secondary | ICD-10-CM

## 2019-09-19 DIAGNOSIS — F17210 Nicotine dependence, cigarettes, uncomplicated: Secondary | ICD-10-CM

## 2019-09-19 DIAGNOSIS — R350 Frequency of micturition: Secondary | ICD-10-CM

## 2019-09-19 DIAGNOSIS — Z122 Encounter for screening for malignant neoplasm of respiratory organs: Secondary | ICD-10-CM

## 2019-09-19 DIAGNOSIS — F319 Bipolar disorder, unspecified: Secondary | ICD-10-CM

## 2019-09-19 DIAGNOSIS — Z21 Asymptomatic human immunodeficiency virus [HIV] infection status: Secondary | ICD-10-CM | POA: Diagnosis not present

## 2019-09-19 DIAGNOSIS — Z1211 Encounter for screening for malignant neoplasm of colon: Secondary | ICD-10-CM

## 2019-09-19 DIAGNOSIS — E782 Mixed hyperlipidemia: Secondary | ICD-10-CM | POA: Insufficient documentation

## 2019-09-19 LAB — PSA: PSA: 0.7 ng/mL (ref 0.10–4.00)

## 2019-09-19 LAB — CBC
HCT: 48.9 % (ref 39.0–52.0)
Hemoglobin: 16.8 g/dL (ref 13.0–17.0)
MCHC: 34.3 g/dL (ref 30.0–36.0)
MCV: 87.1 fl (ref 78.0–100.0)
Platelets: 258 10*3/uL (ref 150.0–400.0)
RBC: 5.62 Mil/uL (ref 4.22–5.81)
RDW: 15.2 % (ref 11.5–15.5)
WBC: 8.5 10*3/uL (ref 4.0–10.5)

## 2019-09-19 LAB — COMPREHENSIVE METABOLIC PANEL
ALT: 12 U/L (ref 0–53)
AST: 13 U/L (ref 0–37)
Albumin: 4.4 g/dL (ref 3.5–5.2)
Alkaline Phosphatase: 77 U/L (ref 39–117)
BUN: 9 mg/dL (ref 6–23)
CO2: 25 mEq/L (ref 19–32)
Calcium: 9.5 mg/dL (ref 8.4–10.5)
Chloride: 101 mEq/L (ref 96–112)
Creatinine, Ser: 1.07 mg/dL (ref 0.40–1.50)
GFR: 85.98 mL/min (ref >60.00)
Glucose, Bld: 88 mg/dL (ref 70–99)
Potassium: 4.2 mEq/L (ref 3.5–5.1)
Sodium: 134 mEq/L — ABNORMAL LOW (ref 135–145)
Total Bilirubin: 0.4 mg/dL (ref 0.2–1.2)
Total Protein: 7.7 g/dL (ref 6.0–8.3)

## 2019-09-19 LAB — POCT URINALYSIS DIPSTICK
Bilirubin, UA: NEGATIVE
Blood, UA: NEGATIVE
Glucose, UA: NEGATIVE
Ketones, UA: NEGATIVE
Leukocytes, UA: NEGATIVE
Nitrite, UA: NEGATIVE
Protein, UA: NEGATIVE
Spec Grav, UA: 1.01 (ref 1.010–1.025)
Urobilinogen, UA: 0.2 E.U./dL
pH, UA: 5.5 (ref 5.0–8.0)

## 2019-09-19 LAB — LIPID PANEL
Cholesterol: 230 mg/dL — ABNORMAL HIGH (ref 0–200)
HDL: 40.9 mg/dL (ref >39.00)
LDL Cholesterol: 159 mg/dL — ABNORMAL HIGH (ref 0–99)
NonHDL: 189
Total CHOL/HDL Ratio: 6
Triglycerides: 151 mg/dL — ABNORMAL HIGH (ref 0.0–149.0)
VLDL: 30.2 mg/dL (ref 0.0–40.0)

## 2019-09-19 LAB — HEMOGLOBIN A1C: Hgb A1c MFr Bld: 5.5 % (ref 4.6–6.5)

## 2019-09-19 NOTE — Patient Instructions (Signed)
Steps to Quit Smoking Smoking tobacco is the leading cause of preventable death. It can affect almost every organ in the body. Smoking puts you and people around you at risk for many serious, long-lasting (chronic) diseases. Quitting smoking can be hard, but it is one of the best things that you can do for your health. It is never too late to quit. How do I get ready to quit? When you decide to quit smoking, make a plan to help you succeed. Before you quit:  Pick a date to quit. Set a date within the next 2 weeks to give you time to prepare.  Write down the reasons why you are quitting. Keep this list in places where you will see it often.  Tell your family, friends, and co-workers that you are quitting. Their support is important.  Talk with your doctor about the choices that may help you quit.  Find out if your health insurance will pay for these treatments.  Know the people, places, things, and activities that make you want to smoke (triggers). Avoid them. What first steps can I take to quit smoking?  Throw away all cigarettes at home, at work, and in your car.  Throw away the things that you use when you smoke, such as ashtrays and lighters.  Clean your car. Make sure to empty the ashtray.  Clean your home, including curtains and carpets. What can I do to help me quit smoking? Talk with your doctor about taking medicines and seeing a counselor at the same time. You are more likely to succeed when you do both.  If you are pregnant or breastfeeding, talk with your doctor about counseling or other ways to quit smoking. Do not take medicine to help you quit smoking unless your doctor tells you to do so. To quit smoking: Quit right away  Quit smoking totally, instead of slowly cutting back on how much you smoke over a period of time.  Go to counseling. You are more likely to quit if you go to counseling sessions regularly. Take medicine You may take medicines to help you quit. Some  medicines need a prescription, and some you can buy over-the-counter. Some medicines may contain a drug called nicotine to replace the nicotine in cigarettes. Medicines may:  Help you to stop having the desire to smoke (cravings).  Help to stop the problems that come when you stop smoking (withdrawal symptoms). Your doctor may ask you to use:  Nicotine patches, gum, or lozenges.  Nicotine inhalers or sprays.  Non-nicotine medicine that is taken by mouth. Find resources Find resources and other ways to help you quit smoking and remain smoke-free after you quit. These resources are most helpful when you use them often. They include:  Online chats with a counselor.  Phone quitlines.  Printed self-help materials.  Support groups or group counseling.  Text messaging programs.  Mobile phone apps. Use apps on your mobile phone or tablet that can help you stick to your quit plan. There are many free apps for mobile phones and tablets as well as websites. Examples include Quit Guide from the CDC and smokefree.gov  What things can I do to make it easier to quit?   Talk to your family and friends. Ask them to support and encourage you.  Call a phone quitline (1-800-QUIT-NOW), reach out to support groups, or work with a counselor.  Ask people who smoke to not smoke around you.  Avoid places that make you want to smoke,   such as: ? Bars. ? Parties. ? Smoke-break areas at work.  Spend time with people who do not smoke.  Lower the stress in your life. Stress can make you want to smoke. Try these things to help your stress: ? Getting regular exercise. ? Doing deep-breathing exercises. ? Doing yoga. ? Meditating. ? Doing a body scan. To do this, close your eyes, focus on one area of your body at a time from head to toe. Notice which parts of your body are tense. Try to relax the muscles in those areas. How will I feel when I quit smoking? Day 1 to 3 weeks Within the first 24 hours,  you may start to have some problems that come from quitting tobacco. These problems are very bad 2-3 days after you quit, but they do not often last for more than 2-3 weeks. You may get these symptoms:  Mood swings.  Feeling restless, nervous, angry, or annoyed.  Trouble concentrating.  Dizziness.  Strong desire for high-sugar foods and nicotine.  Weight gain.  Trouble pooping (constipation).  Feeling like you may vomit (nausea).  Coughing or a sore throat.  Changes in how the medicines that you take for other issues work in your body.  Depression.  Trouble sleeping (insomnia). Week 3 and afterward After the first 2-3 weeks of quitting, you may start to notice more positive results, such as:  Better sense of smell and taste.  Less coughing and sore throat.  Slower heart rate.  Lower blood pressure.  Clearer skin.  Better breathing.  Fewer sick days. Quitting smoking can be hard. Do not give up if you fail the first time. Some people need to try a few times before they succeed. Do your best to stick to your quit plan, and talk with your doctor if you have any questions or concerns. Summary  Smoking tobacco is the leading cause of preventable death. Quitting smoking can be hard, but it is one of the best things that you can do for your health.  When you decide to quit smoking, make a plan to help you succeed.  Quit smoking right away, not slowly over a period of time.  When you start quitting, seek help from your doctor, family, or friends. This information is not intended to replace advice given to you by your health care provider. Make sure you discuss any questions you have with your health care provider. Document Released: 07/23/2009 Document Revised: 12/14/2018 Document Reviewed: 12/15/2018 Elsevier Patient Education  2020 [employer]  Preventing High Cholesterol Cholesterol is a white, waxy substance similar to fat that the human body needs to help build  cells. The liver makes all the cholesterol that a person's body needs. Having high cholesterol (hypercholesterolemia) increases a person's risk for heart disease and stroke. Extra (excess) cholesterol comes from the food the person eats. High cholesterol can often be prevented with diet and lifestyle changes. If you already have high cholesterol, you can control it with diet and lifestyle changes and with medicine. How can high cholesterol affect me? If you have high cholesterol, deposits (plaques) may build up on the walls of your arteries. The arteries are the blood vessels that carry blood away from your heart. Plaques make the arteries narrower and stiffer. This can limit or block blood flow and cause blood clots to form. Blood clots:  Are tiny balls of cells that form in your blood.  Can move to the heart or brain, causing a heart attack or stroke. Plaques  in arteries greatly increase your risk for heart attack and stroke.Making diet and lifestyle changes can reduce your risk for these conditions that may threaten your life. What can increase my risk? This condition is more likely to develop in people who:  Eat foods that are high in saturated fat or cholesterol. Saturated fat is mostly found in: ? Foods that contain animal fat, such as red meat and some dairy products. ? Certain fatty foods made from plants, such as tropical oils.  Are overweight.  Are not getting enough exercise.  Have a family history of high cholesterol. What actions can I take to prevent this? Nutrition   Eat less saturated fat.  Avoid trans fats (partially hydrogenated oils). These are often found in margarine and in some baked goods, fried foods, and snacks bought in packages.  Avoid precooked or cured meat, such as sausages or meat loaves.  Avoid foods and drinks that have added sugars.  Eat more fruits, vegetables, and whole grains.  Choose healthy sources of protein, such as fish, poultry, lean cuts  of red meat, beans, peas, lentils, and nuts.  Choose healthy sources of fat, such as: ? Nuts. ? Vegetable oils, especially olive oil. ? Fish that have healthy fats (omega-3 fatty acids), such as mackerel or salmon. The items listed above may not be a complete list of recommended foods and beverages. Contact a dietitian for more information. Lifestyle  Lose weight if you are overweight. Losing 5-10 lb (2.3-4.5 kg) can help prevent or control high cholesterol. It can also lower your risk for diabetes and high blood pressure. Ask your health care provider to help you with a diet and exercise plan to lose weight safely.  Do not use any products that contain nicotine or tobacco, such as cigarettes, e-cigarettes, and chewing tobacco. If you need help quitting, ask your health care provider.  Limit your alcohol intake. ? Do not drink alcohol if:  Your health care provider tells you not to drink.  You are pregnant, may be pregnant, or are planning to become pregnant. ? If you drink alcohol:  Limit how much you use to:  0-1 drink a day for women.  0-2 drinks a day for men.  Be aware of how much alcohol is in your drink. In the [place] one drink equals one 12 oz bottle of beer (355 mL), one 5 oz glass of wine (148 mL), or one 1 oz glass of hard liquor (44 mL). Activity   Get enough exercise. Each week, do at least 150 minutes of exercise that takes a medium level of effort (moderate-intensity exercise). ? This is exercise that:  Makes your heart beat faster and makes you breathe harder than usual.  Allows you to still be able to talk. ? You could exercise in short sessions several times a day or longer sessions a few times a week. For example, on 5 days each week, you could walk fast or ride your bike 3 times a day for 10 minutes each time.  Do exercises as told by your health care provider. Medicines  In addition to diet and lifestyle changes, your health care provider may recommend  medicines to help lower cholesterol. This may be a medicine to lower the amount of cholesterol your liver makes. You may need medicine if: ? Diet and lifestyle changes do not lower your cholesterol enough. ? You have high cholesterol and other risk factors for heart disease or stroke.  Take over-the-counter and prescription medicines only as  told by your health care provider. General information  Manage your risk factors for high cholesterol. Talk with your health care provider about all your risk factors and how to lower your risk.  Manage other conditions that you have, such as diabetes or high blood pressure (hypertension).  Have blood tests to check your cholesterol levels at regular points in time as told by your health care provider.  Keep all follow-up visits as told by your health care provider. This is important. Where to find more information  American Heart Association: www.heart.org  National Heart, Lung, and Blood Institute: [employer] Summary  High cholesterol increases your risk for heart disease and stroke. By keeping your cholesterol level low, you can reduce your risk for these conditions.  High cholesterol can often be prevented with diet and lifestyle changes.  Work with your health care provider to manage your risk factors, and have your blood tested regularly. This information is not intended to replace advice given to you by your health care provider. Make sure you discuss any questions you have with your health care provider. Document Released: 10/11/2015 Document Revised: 01/18/2019 Document Reviewed: 06/04/2016 Elsevier Patient Education  2020 [employer]  Urinary Frequency, Adult Urinary frequency means urinating more often than usual. You may urinate every 1-2 hours even though you drink a normal amount of fluid and do not have a bladder infection or condition. Although you urinate more often than normal, the total amount of urine produced in a day  is normal. With urinary frequency, you may have an urgent need to urinate often. The stress and anxiety of needing to find a bathroom quickly can make this urge worse. This condition may go away on its own or you may need treatment at home. Home treatment may include bladder training, exercises, taking medicines, or making changes to your diet. Follow these instructions at home: Bladder health   Keep a bladder diary if told by your health care provider. Keep track of: ? What you eat and drink. ? How often you urinate. ? How much you urinate.  Follow a bladder training program if told by your health care provider. This may include: ? Learning to delay going to the bathroom. ? Double urinating (voiding). This helps if you are not completely emptying your bladder. ? Scheduled voiding.  Do Kegel exercises as told by your health care provider. Kegel exercises strengthen the muscles that help control urination, which may help the condition. Eating and drinking  If told by your health care provider, make diet changes, such as: ? Avoiding caffeine. ? Drinking fewer fluids, especially alcohol. ? Not drinking in the evening. ? Avoiding foods or drinks that may irritate the bladder. These include coffee, tea, soda, artificial sweeteners, citrus, tomato-based foods, and chocolate. ? Eating foods that help prevent or ease constipation. Constipation can make this condition worse. Your health care provider may recommend that you:  Drink enough fluid to keep your urine pale yellow.  Take over-the-counter or prescription medicines.  Eat foods that are high in fiber, such as beans, whole grains, and fresh fruits and vegetables.  Limit foods that are high in fat and processed sugars, such as fried or sweet foods. General instructions  Take over-the-counter and prescription medicines only as told by your health care provider.  Keep all follow-up visits as told by your health care provider. This is  important. Contact a health care provider if:  You start urinating more often.  You feel pain or irritation when you  urinate.  You notice blood in your urine.  Your urine looks cloudy.  You develop a fever.  You begin vomiting. Get help right away if:  You are unable to urinate. Summary  Urinary frequency means urinating more often than usual. With urinary frequency, you may urinate every 1-2 hours even though you drink a normal amount of fluid and do not have a bladder infection or other bladder condition.  Your health care provider may recommend that you keep a bladder diary, follow a bladder training program, or make dietary changes.  If told by your health care provider, do Kegel exercises to strengthen the muscles that help control urination.  Take over-the-counter and prescription medicines only as told by your health care provider.  Contact a health care provider if your symptoms do not improve or get worse. This information is not intended to replace advice given to you by your health care provider. Make sure you discuss any questions you have with your health care provider. Document Released: 07/23/2009 Document Revised: 04/05/2018 Document Reviewed: 04/05/2018 Elsevier Patient Education  2020 [place] Risks of Smoking Smoking cigarettes is very bad for your health. Tobacco smoke has over 200 known poisons in it. It contains the poisonous gases nitrogen oxide and carbon monoxide. There are over 60 chemicals in tobacco smoke that cause cancer. Smoking is difficult to quit because a chemical in tobacco, called nicotine, causes addiction or dependence. When you smoke and inhale, nicotine is absorbed rapidly into the bloodstream through your lungs. Both inhaled and non-inhaled nicotine may be addictive. What are the risks of cigarette smoke? Cigarette smokers have an increased risk of many serious medical problems, including:  Lung cancer.  Lung disease,  such as pneumonia, bronchitis, and emphysema.  Chest pain (angina) and heart attack because the heart is not getting enough oxygen.  Heart disease and peripheral blood vessel disease.  High blood pressure (hypertension).  Stroke.  Oral cancer, including cancer of the lip, mouth, or voice box.  Bladder cancer.  Pancreatic cancer.  Cervical cancer.  Pregnancy complications, including premature birth.  Stillbirths and smaller newborn babies, birth defects, and genetic damage to sperm.  Early menopause.  Lower estrogen level for women.  Infertility.  Facial wrinkles.  Blindness.  Increased risk of broken bones (fractures).  Senile dementia.  Stomach ulcers and internal bleeding.  Delayed wound healing and increased risk of complications during surgery.  Even smoking lightly shortens your life expectancy by several years. Because of secondhand smoke exposure, children of smokers have an increased risk of the following:  Sudden infant death syndrome (SIDS).  Respiratory infections.  Lung cancer.  Heart disease.  Ear infections. What are the benefits of quitting? There are many health benefits of quitting smoking. Here are some of them:  Within days of quitting smoking, your risk of having a heart attack decreases, your blood flow improves, and your lung capacity improves. Blood pressure, pulse rate, and breathing patterns start returning to normal soon after quitting.  Within months, your lungs may clear up completely.  Quitting for 10 years reduces your risk of developing lung cancer and heart disease to almost that of a nonsmoker.  People who quit may see an improvement in their overall quality of life. How do I quit smoking?     Smoking is an addiction with both physical and psychological effects, and longtime habits can be hard to change. Your health care provider can recommend:  Programs and community resources, which may include group  support,  education, or talk therapy.  Prescription medicines to help reduce cravings.  Nicotine replacement products, such as patches, gum, and nasal sprays. Use these products only as directed. Do not replace cigarette smoking with electronic cigarettes, which are commonly called e-cigarettes. The safety of e-cigarettes is not known, and some may contain harmful chemicals.  A combination of two or more of these methods. Where to find more information  American Lung Association: www.lung.org  American Cancer Society: www.cancer.org Summary  Smoking cigarettes is very bad for your health. Cigarette smokers have an increased risk of many serious medical problems, including several cancers, heart disease, and stroke.  Smoking is an addiction with both physical and psychological effects, and longtime habits can be hard to change.  By stopping right away, you can greatly reduce the risk of medical problems for you and your family.  To help you quit smoking, your health care provider can recommend programs, community resources, prescription medicines, and nicotine replacement products such as patches, gum, and nasal sprays. This information is not intended to replace advice given to you by your health care provider. Make sure you discuss any questions you have with your health care provider. Document Released: 11/03/2004 Document Revised: 12/28/2017 Document Reviewed: 09/30/2016 Elsevier Patient Education  2020 [employer].
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
## 2019-10-02 ENCOUNTER — Telehealth: Payer: Self-pay | Admitting: *Deleted

## 2019-10-02 NOTE — Telephone Encounter (Signed)
Pt called in and was given his lab results message from Dr. Volanda Napoleon.  He did not have any questions.

## 2019-10-02 NOTE — Telephone Encounter (Signed)
Noted  

## 2019-10-07 NOTE — Telephone Encounter (Signed)
Pt [phone] [id number] Pt has questions from conversion about labs on the 23rd FU

## 2019-10-07 NOTE — Telephone Encounter (Signed)
Returned pt call left a voice message to return my call in the office regarding his lab results

## 2019-10-22 ENCOUNTER — Other Ambulatory Visit: Payer: Self-pay | Admitting: Internal Medicine

## 2019-10-22 DIAGNOSIS — B20 Human immunodeficiency virus [HIV] disease: Secondary | ICD-10-CM

## 2019-10-22 NOTE — Telephone Encounter (Signed)
A letter was sent out to pt to call the office for his lab results

## 2019-11-21 ENCOUNTER — Encounter: Payer: Self-pay | Admitting: Family Medicine

## 2019-12-26 ENCOUNTER — Ambulatory Visit: Payer: Federal, State, Local not specified - PPO

## 2020-01-10 ENCOUNTER — Ambulatory Visit
Admission: RE | Admit: 2020-01-10 | Discharge: 2020-01-10 | Disposition: A | Discharge status: Home / Self Care | Payer: Federal, State, Local not specified - PPO | Source: Ambulatory Visit | Attending: Family Medicine | Admitting: Family Medicine

## 2020-01-10 DIAGNOSIS — F17210 Nicotine dependence, cigarettes, uncomplicated: Secondary | ICD-10-CM

## 2020-01-10 DIAGNOSIS — Z122 Encounter for screening for malignant neoplasm of respiratory organs: Secondary | ICD-10-CM

## 2020-01-22 ENCOUNTER — Ambulatory Visit (INDEPENDENT_AMBULATORY_CARE_PROVIDER_SITE_OTHER): Payer: Federal, State, Local not specified - PPO | Admitting: Family Medicine

## 2020-01-22 ENCOUNTER — Encounter: Payer: Self-pay | Admitting: Family Medicine

## 2020-01-22 ENCOUNTER — Other Ambulatory Visit: Payer: Self-pay

## 2020-01-22 VITALS — BP 98/76 | HR 76 | Temp 97.9°F

## 2020-01-22 DIAGNOSIS — E782 Mixed hyperlipidemia: Secondary | ICD-10-CM

## 2020-01-22 DIAGNOSIS — F17210 Nicotine dependence, cigarettes, uncomplicated: Secondary | ICD-10-CM | POA: Diagnosis not present

## 2020-01-22 DIAGNOSIS — I700 Atherosclerosis of aorta: Secondary | ICD-10-CM

## 2020-01-22 DIAGNOSIS — J432 Centrilobular emphysema: Secondary | ICD-10-CM | POA: Diagnosis not present

## 2020-01-22 DIAGNOSIS — J439 Emphysema, unspecified: Secondary | ICD-10-CM

## 2020-01-22 NOTE — Patient Instructions (Addendum)
High Cholesterol  High cholesterol is a condition in which the blood has high levels of a white, waxy, fat-like substance (cholesterol). The human body needs small amounts of cholesterol. The liver makes all the cholesterol that the body needs. Extra (excess) cholesterol comes from the food that we eat. Cholesterol is carried from the liver by the blood through the blood vessels. If you have high cholesterol, deposits (plaques) may build up on the walls of your blood vessels (arteries). Plaques make the arteries narrower and stiffer. Cholesterol plaques increase your risk for heart attack and stroke. Work with your health care provider to keep your cholesterol levels in a healthy range. What increases the risk? This condition is more likely to develop in people who:  Eat foods that are high in animal fat (saturated fat) or cholesterol.  Are overweight.  Are not getting enough exercise.  Have a family history of high cholesterol. What are the signs or symptoms? There are no symptoms of this condition. How is this diagnosed? This condition may be diagnosed from the results of a blood test.  If you are older than age 20, your health care provider may check your cholesterol every 4-6 years.  You may be checked more often if you already have high cholesterol or other risk factors for heart disease. The blood test for cholesterol measures:  "Bad" cholesterol (LDL cholesterol). This is the main type of cholesterol that causes heart disease. The desired level for LDL is less than 100.  "Good" cholesterol (HDL cholesterol). This type helps to protect against heart disease by cleaning the arteries and carrying the LDL away. The desired level for HDL is 60 or higher.  Triglycerides. These are fats that the body can store or burn for energy. The desired number for triglycerides is lower than 150.  Total cholesterol. This is a measure of the total amount of cholesterol in your blood, including LDL  cholesterol, HDL cholesterol, and triglycerides. A healthy number is less than 200. How is this treated? This condition is treated with diet changes, lifestyle changes, and medicines. Diet changes  This may include eating more whole grains, fruits, vegetables, nuts, and fish.  This may also include cutting back on red meat and foods that have a lot of added sugar. Lifestyle changes  Changes may include getting at least 40 minutes of aerobic exercise 3 times a week. Aerobic exercises include walking, biking, and swimming. Aerobic exercise along with a healthy diet can help you maintain a healthy weight.  Changes may also include quitting smoking. Medicines  Medicines are usually given if diet and lifestyle changes have failed to reduce your cholesterol to healthy levels.  Your health care provider may prescribe a statin medicine. Statin medicines have been shown to reduce cholesterol, which can reduce the risk of heart disease. Follow these instructions at home: Eating and drinking If told by your health care provider:  Eat chicken (without skin), fish, veal, shellfish, ground turkey breast, and round or loin cuts of red meat.  Do not eat fried foods or fatty meats, such as hot dogs and salami.  Eat plenty of fruits, such as apples.  Eat plenty of vegetables, such as broccoli, potatoes, and carrots.  Eat beans, peas, and lentils.  Eat grains such as barley, rice, couscous, and bulgur wheat.  Eat pasta without cream sauces.  Use skim or nonfat milk, and eat low-fat or nonfat yogurt and cheeses.  Do not eat or drink whole milk, cream, ice cream, egg yolks,   or hard cheeses.  Do not eat stick margarine or tub margarines that contain trans fats (also called partially hydrogenated oils).  Do not eat saturated tropical oils, such as coconut oil and palm oil.  Do not eat cakes, cookies, crackers, or other baked goods that contain trans fats.  General instructions  Exercise as  directed by your health care provider. Increase your activity level with activities such as gardening, walking, and taking the stairs.  Take over-the-counter and prescription medicines only as told by your health care provider.  Do not use any products that contain nicotine or tobacco, such as cigarettes and e-cigarettes. If you need help quitting, ask your health care provider.  Keep all follow-up visits as told by your health care provider. This is important. Contact a health care provider if:  You are struggling to maintain a healthy diet or weight.  You need help to start on an exercise program.  You need help to stop smoking. Get help right away if:  You have chest pain.  You have trouble breathing. This information is not intended to replace advice given to you by your health care provider. Make sure you discuss any questions you have with your health care provider. Document Revised: 09/29/2017 Document Reviewed: 03/26/2016 Elsevier Patient Education  2020 [employer]  Steps to Quit Smoking Smoking tobacco is the leading cause of preventable death. It can affect almost every organ in the body. Smoking puts you and people around you at risk for many serious, long-lasting (chronic) diseases. Quitting smoking can be hard, but it is one of the best things that you can do for your health. It is never too late to quit. How do I get ready to quit? When you decide to quit smoking, make a plan to help you succeed. Before you quit:  Pick a date to quit. Set a date within the next 2 weeks to give you time to prepare.  Write down the reasons why you are quitting. Keep this list in places where you will see it often.  Tell your family, friends, and co-workers that you are quitting. Their support is important.  Talk with your doctor about the choices that may help you quit.  Find out if your health insurance will pay for these treatments.  Know the people, places, things, and  activities that make you want to smoke (triggers). Avoid them. What first steps can I take to quit smoking?  Throw away all cigarettes at home, at work, and in your car.  Throw away the things that you use when you smoke, such as ashtrays and lighters.  Clean your car. Make sure to empty the ashtray.  Clean your home, including curtains and carpets. What can I do to help me quit smoking? Talk with your doctor about taking medicines and seeing a counselor at the same time. You are more likely to succeed when you do both.  If you are pregnant or breastfeeding, talk with your doctor about counseling or other ways to quit smoking. Do not take medicine to help you quit smoking unless your doctor tells you to do so. To quit smoking: Quit right away  Quit smoking totally, instead of slowly cutting back on how much you smoke over a period of time.  Go to counseling. You are more likely to quit if you go to counseling sessions regularly. Take medicine You may take medicines to help you quit. Some medicines need a prescription, and some you can buy over-the-counter. Some medicines  may contain a drug called nicotine to replace the nicotine in cigarettes. Medicines may:  Help you to stop having the desire to smoke (cravings).  Help to stop the problems that come when you stop smoking (withdrawal symptoms). Your doctor may ask you to use:  Nicotine patches, gum, or lozenges.  Nicotine inhalers or sprays.  Non-nicotine medicine that is taken by mouth. Find resources Find resources and other ways to help you quit smoking and remain smoke-free after you quit. These resources are most helpful when you use them often. They include:  Online chats with a Social worker.  Phone quitlines.  Printed Furniture conservator/restorer.  Support groups or group counseling.  Text messaging programs.  Mobile phone apps. Use apps on your mobile phone or tablet that can help you stick to your quit plan. There are many  free apps for mobile phones and tablets as well as websites. Examples include Quit Guide from the [employer] and smokefree.gov  What things can I do to make it easier to quit?   Talk to your family and friends. Ask them to support and encourage you.  Call a phone quitline (1-800-QUIT-NOW), reach out to support groups, or work with a Social worker.  Ask people who smoke to not smoke around you.  Avoid places that make you want to smoke, such as: ? Bars. ? Parties. ? Smoke-break areas at work.  Spend time with people who do not smoke.  Lower the stress in your life. Stress can make you want to smoke. Try these things to help your stress: ? Getting regular exercise. ? Doing deep-breathing exercises. ? Doing yoga. ? Meditating. ? Doing a body scan. To do this, close your eyes, focus on one area of your body at a time from head to toe. Notice which parts of your body are tense. Try to relax the muscles in those areas. How will I feel when I quit smoking? Day 1 to 3 weeks Within the first 24 hours, you may start to have some problems that come from quitting tobacco. These problems are very bad 2-3 days after you quit, but they do not often last for more than 2-3 weeks. You may get these symptoms:  Mood swings.  Feeling restless, nervous, angry, or annoyed.  Trouble concentrating.  Dizziness.  Strong desire for high-sugar foods and nicotine.  Weight gain.  Trouble pooping (constipation).  Feeling like you may vomit (nausea).  Coughing or a sore throat.  Changes in how the medicines that you take for other issues work in your body.  Depression.  Trouble sleeping (insomnia). Week 3 and afterward After the first 2-3 weeks of quitting, you may start to notice more positive results, such as:  Better sense of smell and taste.  Less coughing and sore throat.  Slower heart rate.  Lower blood pressure.  Clearer skin.  Better breathing.  Fewer sick days. Quitting smoking can be  hard. Do not give up if you fail the first time. Some people need to try a few times before they succeed. Do your best to stick to your quit plan, and talk with your doctor if you have any questions or concerns. Summary  Smoking tobacco is the leading cause of preventable death. Quitting smoking can be hard, but it is one of the best things that you can do for your health.  When you decide to quit smoking, make a plan to help you succeed.  Quit smoking right away, not slowly over a period of time.  When  you start quitting, seek help from your doctor, family, or friends. This information is not intended to replace advice given to you by your health care provider. Make sure you discuss any questions you have with your health care provider. Document Revised: 06/21/2019 Document Reviewed: 12/15/2018 Elsevier Patient Education  2020 Elsevier Inc.  Chronic Obstructive Pulmonary Disease  Chronic obstructive pulmonary disease (COPD) is a long-term (chronic) condition that affects the lungs. COPD is a general term that can be used to describe many different lung problems that cause lung swelling (inflammation) and limit airflow, including chronic bronchitis and emphysema. If you have COPD, your lung function will probably never return to normal. In most cases, it gets worse over time. However, there are steps you can take to slow the progression of the disease and improve your quality of life. What are the causes? This condition may be caused by:  Smoking. This is the most common cause.  Certain genes passed down through families. What increases the risk? The following factors may make you more likely to develop this condition:  Secondhand smoke from cigarettes, pipes, or cigars.  Exposure to chemicals and other irritants such as fumes and dust in the work environment.  Chronic lung conditions or infections. What are the signs or symptoms? Symptoms of this condition include:  Shortness of  breath, especially during physical activity.  Chronic cough with a large amount of thick mucus. Sometimes the cough may not have any mucus (dry cough).  Wheezing.  Rapid breaths.  Gray or bluish discoloration (cyanosis) of the skin, especially in your fingers, toes, or lips.  Feeling tired (fatigue).  Weight loss.  Chest tightness.  Frequent infections.  Episodes when breathing symptoms become much worse (exacerbations).  Swelling in the ankles, feet, or legs. This may occur in later stages of the disease. How is this diagnosed? This condition is diagnosed based on:  Your medical history.  A physical exam. You may also have tests, including:  Lung (pulmonary) function tests. This may include a spirometry test, which measures your ability to exhale properly.  Chest X-ray.  CT scan.  Blood tests. How is this treated? This condition may be treated with:  Medicines. These may include inhaled rescue medicines to treat acute exacerbations as well as long-term, or maintenance, medicines to prevent flare-ups of COPD. ? Bronchodilators help treat COPD by dilating the airways to allow increased airflow and make your breathing more comfortable. ? Steroids can reduce airway inflammation and help prevent exacerbations.  Smoking cessation. If you smoke, your health care provider may ask you to quit, and may also recommend therapy or replacement products to help you quit.  Pulmonary rehabilitation. This may involve working with a team of health care providers and specialists, such as respiratory, occupational, and physical therapists.  Exercise and physical activity. These are beneficial for nearly all people with COPD.  Nutrition therapy to gain weight, if you are underweight.  Oxygen. Supplemental oxygen therapy is only helpful if you have a low oxygen level in your blood (hypoxemia).  Lung surgery or transplant.  Palliative care. This is to help people with COPD feel  comfortable when treatment is no longer working. Follow these instructions at home: Medicines  Take over-the-counter and prescription medicines (inhaled or pills) only as told by your health care provider.  Talk to your health care provider before taking any cough or allergy medicines. You may need to avoid certain medicines that dry out your airways. Lifestyle  If you are a smoker,  the most important thing that you can do is to stop smoking. Do not use any products that contain nicotine or tobacco, such as cigarettes and e-cigarettes. If you need help quitting, ask your health care provider. Continuing to smoke will cause the disease to progress faster.  Avoid exposure to things that irritate your lungs, such as smoke, chemicals, and fumes.  Stay active, but balance activity with periods of rest. Exercise and physical activity will help you maintain your ability to do things you want to do.  Learn and use relaxation techniques to manage stress and to control your breathing.  Get the right amount of sleep and get quality sleep. Most adults need 7 or more hours per night.  Eat healthy foods. Eating smaller, more frequent meals and resting before meals may help you maintain your strength. Controlled breathing Learn and use controlled breathing techniques as directed by your health care provider. Controlled breathing techniques include:  Pursed lip breathing. Start by breathing in (inhaling) through your nose for 1 second. Then, purse your lips as if you were going to whistle and breathe out (exhale) through the pursed lips for 2 seconds.  Diaphragmatic breathing. Start by putting one hand on your abdomen just above your waist. Inhale slowly through your nose. The hand on your abdomen should move out. Then purse your lips and exhale slowly. You should be able to feel the hand on your abdomen moving in as you exhale. Controlled coughing Learn and use controlled coughing to clear mucus from  your lungs. Controlled coughing is a series of short, progressive coughs. The steps of controlled coughing are: 1. Lean your head slightly forward. 2. Breathe in deeply using diaphragmatic breathing. 3. Try to hold your breath for 3 seconds. 4. Keep your mouth slightly open while coughing twice. 5. Spit any mucus out into a tissue. 6. Rest and repeat the steps once or twice as needed. General instructions  Make sure you receive all the vaccines that your health care provider recommends, especially the pneumococcal and influenza vaccines. Preventing infection and hospitalization is very important when you have COPD.  Use oxygen therapy and pulmonary rehabilitation if directed to by your health care provider. If you require home oxygen therapy, ask your health care provider whether you should purchase a pulse oximeter to measure your oxygen level at home.  Work with your health care provider to develop a COPD action plan. This will help you know what steps to take if your condition gets worse.  Keep other chronic health conditions under control as told by your health care provider.  Avoid extreme temperature and humidity changes.  Avoid contact with people who have an illness that spreads from person to person (is contagious), such as viral infections or pneumonia.  Keep all follow-up visits as told by your health care provider. This is important. Contact a health care provider if:  You are coughing up more mucus than usual.  There is a change in the color or thickness of your mucus.  Your breathing is more labored than usual.  Your breathing is faster than usual.  You have difficulty sleeping.  You need to use your rescue medicines or inhalers more often than expected.  You have trouble doing routine activities such as getting dressed or walking around the house. Get help right away if:  You have shortness of breath while you are resting.  You have shortness of breath that  prevents you from: ? Being able to talk. ? Performing your  usual physical activities.  You have chest pain lasting longer than 5 minutes.  Your skin color is more blue (cyanotic) than usual.  You measure low oxygen saturations for longer than 5 minutes with a pulse oximeter.  You have a fever.  You feel too tired to breathe normally. Summary  Chronic obstructive pulmonary disease (COPD) is a long-term (chronic) condition that affects the lungs.  Your lung function will probably never return to normal. In most cases, it gets worse over time. However, there are steps you can take to slow the progression of the disease and improve your quality of life.  Treatment for COPD may include taking medicines, quitting smoking, pulmonary rehabilitation, and changes to diet and exercise. As the disease progresses, you may need oxygen therapy, a lung transplant, or palliative care.  To help manage your condition, do not smoke, avoid exposure to things that irritate your lungs, stay up to date on all vaccines, and follow your health care provider's instructions for taking medicines. This information is not intended to replace advice given to you by your health care provider. Make sure you discuss any questions you have with your health care provider. Document Revised: 09/08/2017 Document Reviewed: 10/31/2016 Elsevier Patient Education  2020 [employer].

## 2020-01-22 NOTE — Progress Notes (Addendum)
Subjective:    Patient ID: Randall Garrett, male    DOB: 10/29/1960, 59 y.o.   MRN: [id number]  No chief complaint on file.   HPI Patient was seen today for f/u on results.  Pt inquires about lab and CT results.  Pt states he was given results but wants to make sure he is okay.  Pt states he has to find something to do with time as he is still smoking cigarettes, 1 pack/day.  Pt considering going back to school or finding a hobby.  Pt considering getting a car in 1 year. Currently riding the bus.  Pt denies headaches, chest pain, SOB.  Past Medical History:  Diagnosis Date  . Bipolar 1 disorder (HCC) 05/13/2011  . Cigarette smoker 05/26/2011   Have asked him to continue consideration of quitting smoking completely.   . Depression   . HIV (human immunodeficiency virus infection) (HCC) 05/13/2011  . HIV infection (HCC)   . Rash, skin 05/13/2011   Bilateral arm    . Schizo affective schizophrenia (HCC) 05/13/2011    No Known Allergies  ROS General: Denies fever, chills, night sweats, changes in weight, changes in appetite HEENT: Denies headaches, ear pain, changes in vision, rhinorrhea, sore throat CV: Denies CP, palpitations, SOB, orthopnea Pulm: Denies SOB, cough, wheezing GI: Denies abdominal pain, nausea, vomiting, diarrhea, constipation GU: Denies dysuria, hematuria, frequency, vaginal discharge Msk: Denies muscle cramps, joint pains Neuro: Denies weakness, numbness, tingling Skin: Denies rashes, bruising Psych: Denies depression, anxiety, hallucinations    Objective:    Blood pressure 98/76, pulse 76, temperature 97.9 F (36.6 C), temperature source Temporal, SpO2 96 %.   Gen. Pleasant, well-nourished, in no distress, normal affect.   HEENT: [place]/AT, face symmetric, no scleral icterus, PERRLA, EOMI, nares patent without drainage Lungs: no accessory muscle use, CTAB, no wheezes or rales Cardiovascular: RRR, no peripheral edema Neuro:  A&Ox3, CN II-XII intact, normal  gait Skin:  Warm, no lesions/ rash  Wt Readings from Last 3 Encounters:  09/19/19 248 lb 9.6 oz (112.8 kg)  05/01/19 254 lb (115.2 kg)  12/07/17 257 lb (116.6 kg)    Lab Results  Component Value Date   WBC 8.5 09/19/2019   HGB 16.8 09/19/2019   HCT 48.9 09/19/2019   PLT 258.0 09/19/2019   GLUCOSE 88 09/19/2019   CHOL 230 (H) 09/19/2019   TRIG 151.0 (H) 09/19/2019   HDL 40.90 09/19/2019   LDLCALC 159 (H) 09/19/2019   ALT 12 09/19/2019   AST 13 09/19/2019   NA 134 (L) 09/19/2019   K 4.2 09/19/2019   CL 101 09/19/2019   CREATININE 1.07 09/19/2019   BUN 9 09/19/2019   CO2 25 09/19/2019   TSH 0.12 (L) 06/24/2015   PSA 0.70 09/19/2019   HGBA1C 5.5 09/19/2019    Assessment/Plan:  Centrilobular emphysema(HCC) -Centrilobular and paraseptal emphysema noted 01/10/20 on CT chest for lung cancer screening.  -Currently stable -We will continue to monitor -Smoking cessation strongly advised  Mixed hyperlipidemia -Lifestyle modifications encouraged  Cigarette nicotine dependence without complication -Smoking cessation counseling greater than 3 minutes, less than 10 minutes -Smoking 1 pack/day -Discussed various ways to help patient quit.  Pt contemplative. -We will continue to monitor -Lung cancer screening CT done 01/10/2020.  Category 2, benign appearance or behavior annual low-dose CT chest yearly.  Three-vessel coronary atherosclerosis.  Dilated main pulmonary artery suggesting pulmonary hypertension noted.  Aortic atherosclerosis and eczema.  Aortic atherosclerosis -Continue lifestyle modifications -Last lipid panel 09/19/2019 with cholesterol 230, LDL  159, triglycerides 159 -Recheck cholesterol during upcoming CPE when patient is fasting -For continued elevation will start statin  F/u prn in the next month for CPE and lipid panel  Grier Mitts, MD

## 2020-02-03 ENCOUNTER — Encounter: Payer: Self-pay | Admitting: Family Medicine

## 2020-02-03 ENCOUNTER — Other Ambulatory Visit: Payer: Self-pay

## 2020-02-03 ENCOUNTER — Ambulatory Visit (INDEPENDENT_AMBULATORY_CARE_PROVIDER_SITE_OTHER): Payer: Federal, State, Local not specified - PPO | Admitting: Family Medicine

## 2020-02-03 VITALS — BP 102/74 | HR 60 | Temp 97.7°F | Wt 246.0 lb

## 2020-02-03 DIAGNOSIS — E782 Mixed hyperlipidemia: Secondary | ICD-10-CM

## 2020-02-03 DIAGNOSIS — Z125 Encounter for screening for malignant neoplasm of prostate: Secondary | ICD-10-CM

## 2020-02-03 DIAGNOSIS — Z0000 Encounter for general adult medical examination without abnormal findings: Secondary | ICD-10-CM

## 2020-02-03 DIAGNOSIS — F17210 Nicotine dependence, cigarettes, uncomplicated: Secondary | ICD-10-CM | POA: Diagnosis not present

## 2020-02-03 DIAGNOSIS — Z1211 Encounter for screening for malignant neoplasm of colon: Secondary | ICD-10-CM

## 2020-02-03 DIAGNOSIS — R350 Frequency of micturition: Secondary | ICD-10-CM

## 2020-02-03 DIAGNOSIS — Z21 Asymptomatic human immunodeficiency virus [HIV] infection status: Secondary | ICD-10-CM | POA: Diagnosis not present

## 2020-02-03 LAB — POC URINALSYSI DIPSTICK (AUTOMATED)
Bilirubin, UA: NEGATIVE
Blood, UA: NEGATIVE
Glucose, UA: NEGATIVE
Ketones, UA: NEGATIVE
Leukocytes, UA: NEGATIVE
Nitrite, UA: NEGATIVE
Protein, UA: NEGATIVE
Spec Grav, UA: 1.01 (ref 1.010–1.025)
Urobilinogen, UA: 0.2 E.U./dL
pH, UA: 5.5 (ref 5.0–8.0)

## 2020-02-03 LAB — CBC WITH DIFFERENTIAL/PLATELET
Basophils Absolute: 0.1 10*3/uL (ref 0.0–0.1)
Basophils Relative: 0.9 % (ref 0.0–3.0)
Eosinophils Absolute: 0.1 10*3/uL (ref 0.0–0.7)
Eosinophils Relative: 0.8 % (ref 0.0–5.0)
HCT: 49.3 % (ref 39.0–52.0)
Hemoglobin: 17.1 g/dL — ABNORMAL HIGH (ref 13.0–17.0)
Lymphocytes Relative: 42.7 % (ref 12.0–46.0)
Lymphs Abs: 2.9 10*3/uL (ref 0.7–4.0)
MCHC: 34.8 g/dL (ref 30.0–36.0)
MCV: 87.9 fl (ref 78.0–100.0)
Monocytes Absolute: 0.4 10*3/uL (ref 0.1–1.0)
Monocytes Relative: 6.1 % (ref 3.0–12.0)
Neutro Abs: 3.4 10*3/uL (ref 1.4–7.7)
Neutrophils Relative %: 49.5 % (ref 43.0–77.0)
Platelets: 212 10*3/uL (ref 150.0–400.0)
RBC: 5.6 Mil/uL (ref 4.22–5.81)
RDW: 15.7 % — ABNORMAL HIGH (ref 11.5–15.5)
WBC: 6.8 10*3/uL (ref 4.0–10.5)

## 2020-02-03 LAB — COMPREHENSIVE METABOLIC PANEL
ALT: 14 U/L (ref 0–53)
AST: 23 U/L (ref 0–37)
Albumin: 4.5 g/dL (ref 3.5–5.2)
Alkaline Phosphatase: 76 U/L (ref 39–117)
BUN: 12 mg/dL (ref 6–23)
CO2: 25 mEq/L (ref 19–32)
Calcium: 9.2 mg/dL (ref 8.4–10.5)
Chloride: 100 mEq/L (ref 96–112)
Creatinine, Ser: 1.05 mg/dL (ref 0.40–1.50)
GFR: 87.75 mL/min (ref >60.00)
Glucose, Bld: 88 mg/dL (ref 70–99)
Potassium: 4.5 mEq/L (ref 3.5–5.1)
Sodium: 133 mEq/L — ABNORMAL LOW (ref 135–145)
Total Bilirubin: 0.5 mg/dL (ref 0.2–1.2)
Total Protein: 7.6 g/dL (ref 6.0–8.3)

## 2020-02-03 LAB — T4, FREE: Free T4: 0.86 ng/dL (ref 0.60–1.60)

## 2020-02-03 LAB — LIPID PANEL
Cholesterol: 245 mg/dL — ABNORMAL HIGH (ref 0–200)
HDL: 42 mg/dL (ref >39.00)
LDL Cholesterol: 188 mg/dL — ABNORMAL HIGH (ref 0–99)
NonHDL: 202.83
Total CHOL/HDL Ratio: 6
Triglycerides: 74 mg/dL (ref 0.0–149.0)
VLDL: 14.8 mg/dL (ref 0.0–40.0)

## 2020-02-03 LAB — HEMOGLOBIN A1C: Hgb A1c MFr Bld: 5.6 % (ref 4.6–6.5)

## 2020-02-03 LAB — TSH: TSH: 2.8 u[IU]/mL (ref 0.35–4.50)

## 2020-02-03 LAB — PSA: PSA: 0.53 ng/mL (ref 0.10–4.00)

## 2020-02-03 NOTE — Patient Instructions (Addendum)
Preventive Care 41-58 Years Old, Male Preventive care refers to lifestyle choices and visits with your health care provider that can promote health and wellness. This includes:  A yearly physical exam. This is also called an annual well check.  Regular dental and eye exams.  Immunizations.  Screening for certain conditions.  Healthy lifestyle choices, such as eating a healthy diet, getting regular exercise, not using drugs or products that contain nicotine and tobacco, and limiting alcohol use. What can I expect for my preventive care visit? Physical exam Your health care provider will check:  Height and weight. These may be used to calculate body mass index (BMI), which is a measurement that tells if you are at a healthy weight.  Heart rate and blood pressure.  Your skin for abnormal spots. Counseling Your health care provider may ask you questions about:  Alcohol, tobacco, and drug use.  Emotional well-being.  Home and relationship well-being.  Sexual activity.  Eating habits.  Work and work Statistician. What immunizations do I need?  Influenza (flu) vaccine  This is recommended every year. Tetanus, diphtheria, and pertussis (Tdap) vaccine  You may need a Td booster every 10 years. Varicella (chickenpox) vaccine  You may need this vaccine if you have not already been vaccinated. Zoster (shingles) vaccine  You may need this after age 64. Measles, mumps, and rubella (MMR) vaccine  You may need at least one dose of MMR if you were born in 1957 or later. You may also need a second dose. Pneumococcal conjugate (PCV13) vaccine  You may need this if you have certain conditions and were not previously vaccinated. Pneumococcal polysaccharide (PPSV23) vaccine  You may need one or two doses if you smoke cigarettes or if you have certain conditions. Meningococcal conjugate (MenACWY) vaccine  You may need this if you have certain conditions. Hepatitis A  vaccine  You may need this if you have certain conditions or if you travel or work in places where you may be exposed to hepatitis A. Hepatitis B vaccine  You may need this if you have certain conditions or if you travel or work in places where you may be exposed to hepatitis B. Haemophilus influenzae type b (Hib) vaccine  You may need this if you have certain risk factors. Human papillomavirus (HPV) vaccine  If recommended by your health care provider, you may need three doses over 6 months. You may receive vaccines as individual doses or as more than one vaccine together in one shot (combination vaccines). Talk with your health care provider about the risks and benefits of combination vaccines. What tests do I need? Blood tests  Lipid and cholesterol levels. These may be checked every 5 years, or more frequently if you are over 60 years old.  Hepatitis C test.  Hepatitis B test. Screening  Lung cancer screening. You may have this screening every year starting at age 43 if you have a 30-pack-year history of smoking and currently smoke or have quit within the past 15 years.  Prostate cancer screening. Recommendations will vary depending on your family history and other risks.  Colorectal cancer screening. All adults should have this screening starting at age 72 and continuing until age 2. Your health care provider may recommend screening at age 14 if you are at increased risk. You will have tests every 1-10 years, depending on your results and the type of screening test.  Diabetes screening. This is done by checking your blood sugar (glucose) after you have not eaten  for a while (fasting). You may have this done every 1-3 years.  Sexually transmitted disease (STD) testing. Follow these instructions at home: Eating and drinking  Eat a diet that includes fresh fruits and vegetables, whole grains, lean protein, and low-fat dairy products.  Take vitamin and mineral supplements as  recommended by your health care provider.  Do not drink alcohol if your health care provider tells you not to drink.  If you drink alcohol: ? Limit how much you have to 0-2 drinks a day. ? Be aware of how much alcohol is in your drink. In the [place] one drink equals one 12 oz bottle of beer (355 mL), one 5 oz glass of wine (148 mL), or one 1 oz glass of hard liquor (44 mL). Lifestyle  Take daily care of your teeth and gums.  Stay active. Exercise for at least 30 minutes on 5 or more days each week.  Do not use any products that contain nicotine or tobacco, such as cigarettes, e-cigarettes, and chewing tobacco. If you need help quitting, ask your health care provider.  If you are sexually active, practice safe sex. Use a condom or other form of protection to prevent STIs (sexually transmitted infections).  Talk with your health care provider about taking a low-dose aspirin every day starting at age 89. What's next?  Go to your health care provider once a year for a well check visit.  Ask your health care provider how often you should have your eyes and teeth checked.  Stay up to date on all vaccines. This information is not intended to replace advice given to you by your health care provider. Make sure you discuss any questions you have with your health care provider. Document Revised: 09/20/2018 Document Reviewed: 09/20/2018 Elsevier Patient Education  [place].  Dyslipidemia Dyslipidemia is an imbalance of waxy, fat-like substances (lipids) in the blood. The body needs lipids in small amounts. Dyslipidemia often involves a high level of cholesterol or triglycerides, which are types of lipids. Common forms of dyslipidemia include:  High levels of LDL cholesterol. LDL is the type of cholesterol that causes fatty deposits (plaques) to build up in the blood vessels that carry blood away from your heart (arteries).  Low levels of HDL cholesterol. HDL cholesterol is the type  of cholesterol that protects against heart disease. High levels of HDL remove the LDL buildup from arteries.  High levels of triglycerides. Triglycerides are a fatty substance in the blood that is linked to a buildup of plaques in the arteries. What are the causes? Primary dyslipidemia is caused by changes (mutations) in genes that are passed down through families (inherited). These mutations cause several types of dyslipidemia. Secondary dyslipidemia is caused by lifestyle choices and diseases that lead to dyslipidemia, such as:  Eating a diet that is high in animal fat.  Not getting enough exercise.  Having diabetes, kidney disease, liver disease, or thyroid disease.  Drinking large amounts of alcohol.  Using certain medicines. What increases the risk? You are more likely to develop this condition if you are an older man or if you are a woman who has gone through menopause. Other risk factors include:  Having a family history of dyslipidemia.  Taking certain medicines, including birth control pills, steroids, some diuretics, and beta-blockers.  Smoking cigarettes.  Eating a high-fat diet.  Having certain medical conditions such as diabetes, polycystic ovary syndrome (PCOS), kidney disease, liver disease, or hypothyroidism.  Not exercising regularly.  Being overweight or  obese with too much belly fat. What are the signs or symptoms? In most cases, dyslipidemia does not usually cause any symptoms. In severe cases, very high lipid levels can cause:  Fatty bumps under the skin (xanthomas).  White or gray ring around the black center (pupil) of the eye. Very high triglyceride levels can cause inflammation of the pancreas (pancreatitis). How is this diagnosed? Your health care provider may diagnose dyslipidemia based on a routine blood test (fasting blood test). Because most people do not have symptoms of the condition, this blood testing (lipid profile) is done on adults age 61  and older and is repeated every 5 years. This test checks:  Total cholesterol. This measures the total amount of cholesterol in your blood, including LDL cholesterol, HDL cholesterol, and triglycerides. A healthy number is below 200.  LDL cholesterol. The target number for LDL cholesterol is different for each person, depending on individual risk factors. Ask your health care provider what your LDL cholesterol should be.  HDL cholesterol. An HDL level of 60 or higher is best because it helps to protect against heart disease. A number below 72 for men or below 1 for women increases the risk for heart disease.  Triglycerides. A healthy triglyceride number is below 150. If your lipid profile is abnormal, your health care provider may do other blood tests. How is this treated? Treatment depends on the type of dyslipidemia that you have and your other risk factors for heart disease and stroke. Your health care provider will have a target range for your lipid levels based on this information. For many people, this condition may be treated by lifestyle changes, such as diet and exercise. Your health care provider may recommend that you:  Get regular exercise.  Make changes to your diet.  Quit smoking if you smoke. If diet changes and exercise do not help you reach your goals, your health care provider may also prescribe medicine to lower lipids. The most commonly prescribed type of medicine lowers your LDL cholesterol (statin drug). If you have a high triglyceride level, your provider may prescribe another type of drug (fibrate) or an omega-3 fish oil supplement, or both. Follow these instructions at home:  Eating and drinking  Follow instructions from your health care provider or dietitian about eating or drinking restrictions.  Eat a healthy diet as told by your health care provider. This can help you reach and maintain a healthy weight, lower your LDL cholesterol, and raise your HDL  cholesterol. This may include: ? Limiting your calories, if you are overweight. ? Eating more fruits, vegetables, whole grains, fish, and lean meats. ? Limiting saturated fat, trans fat, and cholesterol.  If you drink alcohol: ? Limit how much you use. ? Be aware of how much alcohol is in your drink. In the [place] one drink equals one 12 oz bottle of beer (355 mL), one 5 oz glass of wine (148 mL), or one 1 oz glass of hard liquor (44 mL).  Do not drink alcohol if: ? Your health care provider tells you not to drink. ? You are pregnant, may be pregnant, or are planning to become pregnant. Activity  Get regular exercise. Start an exercise and strength training program as told by your health care provider. Ask your health care provider what activities are safe for you. Your health care provider may recommend: ? 30 minutes of aerobic activity 4-6 days a week. Brisk walking is an example of aerobic activity. ? Strength training 2  days a week. General instructions  Do not use any products that contain nicotine or tobacco, such as cigarettes, e-cigarettes, and chewing tobacco. If you need help quitting, ask your health care provider.  Take over-the-counter and prescription medicines only as told by your health care provider. This includes supplements.  Keep all follow-up visits as told by your health care provider. Contact a health care provider if:  You are: ? Having trouble sticking to your exercise or diet plan. ? Struggling to quit smoking or control your use of alcohol. Summary  Dyslipidemia often involves a high level of cholesterol or triglycerides, which are types of lipids.  Treatment depends on the type of dyslipidemia that you have and your other risk factors for heart disease and stroke.  For many people, treatment starts with lifestyle changes, such as diet and exercise.  Your health care provider may prescribe medicine to lower lipids. This information is not intended to  replace advice given to you by your health care provider. Make sure you discuss any questions you have with your health care provider. Document Revised: 05/21/2018 Document Reviewed: 04/27/2018 Elsevier Patient Education  [place] Risks of Smoking Smoking cigarettes is very bad for your health. Tobacco smoke has over 200 known poisons in it. It contains the poisonous gases nitrogen oxide and carbon monoxide. There are over 60 chemicals in tobacco smoke that cause cancer. Smoking is difficult to quit because a chemical in tobacco, called nicotine, causes addiction or dependence. When you smoke and inhale, nicotine is absorbed rapidly into the bloodstream through your lungs. Both inhaled and non-inhaled nicotine may be addictive. What are the risks of cigarette smoke? Cigarette smokers have an increased risk of many serious medical problems, including:  Lung cancer.  Lung disease, such as pneumonia, bronchitis, and emphysema.  Chest pain (angina) and heart attack because the heart is not getting enough oxygen.  Heart disease and peripheral blood vessel disease.  High blood pressure (hypertension).  Stroke.  Oral cancer, including cancer of the lip, mouth, or voice box.  Bladder cancer.  Pancreatic cancer.  Cervical cancer.  Pregnancy complications, including premature birth.  Stillbirths and smaller newborn babies, birth defects, and genetic damage to sperm.  Early menopause.  Lower estrogen level for women.  Infertility.  Facial wrinkles.  Blindness.  Increased risk of broken bones (fractures).  Senile dementia.  Stomach ulcers and internal bleeding.  Delayed wound healing and increased risk of complications during surgery.  Even smoking lightly shortens your life expectancy by several years. Because of secondhand smoke exposure, children of smokers have an increased risk of the following:  Sudden infant death syndrome (SIDS).  Respiratory  infections.  Lung cancer.  Heart disease.  Ear infections. What are the benefits of quitting? There are many health benefits of quitting smoking. Here are some of them:  Within days of quitting smoking, your risk of having a heart attack decreases, your blood flow improves, and your lung capacity improves. Blood pressure, pulse rate, and breathing patterns start returning to normal soon after quitting.  Within months, your lungs may clear up completely.  Quitting for 10 years reduces your risk of developing lung cancer and heart disease to almost that of a nonsmoker.  People who quit may see an improvement in their overall quality of life. How do I quit smoking?     Smoking is an addiction with both physical and psychological effects, and longtime habits can be hard to change. Your health care provider  can recommend:  Programs and community resources, which may include group support, education, or talk therapy.  Prescription medicines to help reduce cravings.  Nicotine replacement products, such as patches, gum, and nasal sprays. Use these products only as directed. Do not replace cigarette smoking with electronic cigarettes, which are commonly called e-cigarettes. The safety of e-cigarettes is not known, and some may contain harmful chemicals.  A combination of two or more of these methods. Where to find more information  American Lung Association: www.lung.org  American Cancer Society: www.cancer.org Summary  Smoking cigarettes is very bad for your health. Cigarette smokers have an increased risk of many serious medical problems, including several cancers, heart disease, and stroke.  Smoking is an addiction with both physical and psychological effects, and longtime habits can be hard to change.  By stopping right away, you can greatly reduce the risk of medical problems for you and your family.  To help you quit smoking, your health care provider can recommend programs,  community resources, prescription medicines, and nicotine replacement products such as patches, gum, and nasal sprays. This information is not intended to replace advice given to you by your health care provider. Make sure you discuss any questions you have with your health care provider. Document Revised: 12/28/2017 Document Reviewed: 09/30/2016 Elsevier Patient Education  2020 [employer].  Steps to Quit Smoking Smoking tobacco is the leading cause of preventable death. It can affect almost every organ in the body. Smoking puts you and people around you at risk for many serious, long-lasting (chronic) diseases. Quitting smoking can be hard, but it is one of the best things that you can do for your health. It is never too late to quit. How do I get ready to quit? When you decide to quit smoking, make a plan to help you succeed. Before you quit:  Pick a date to quit. Set a date within the next 2 weeks to give you time to prepare.  Write down the reasons why you are quitting. Keep this list in places where you will see it often.  Tell your family, friends, and co-workers that you are quitting. Their support is important.  Talk with your doctor about the choices that may help you quit.  Find out if your health insurance will pay for these treatments.  Know the people, places, things, and activities that make you want to smoke (triggers). Avoid them. What first steps can I take to quit smoking?  Throw away all cigarettes at home, at work, and in your car.  Throw away the things that you use when you smoke, such as ashtrays and lighters.  Clean your car. Make sure to empty the ashtray.  Clean your home, including curtains and carpets. What can I do to help me quit smoking? Talk with your doctor about taking medicines and seeing a counselor at the same time. You are more likely to succeed when you do both.  If you are pregnant or breastfeeding, talk with your doctor about counseling or  other ways to quit smoking. Do not take medicine to help you quit smoking unless your doctor tells you to do so. To quit smoking: Quit right away  Quit smoking totally, instead of slowly cutting back on how much you smoke over a period of time.  Go to counseling. You are more likely to quit if you go to counseling sessions regularly. Take medicine You may take medicines to help you quit. Some medicines need a prescription, and some you  can buy over-the-counter. Some medicines may contain a drug called nicotine to replace the nicotine in cigarettes. Medicines may:  Help you to stop having the desire to smoke (cravings).  Help to stop the problems that come when you stop smoking (withdrawal symptoms). Your doctor may ask you to use:  Nicotine patches, gum, or lozenges.  Nicotine inhalers or sprays.  Non-nicotine medicine that is taken by mouth. Find resources Find resources and other ways to help you quit smoking and remain smoke-free after you quit. These resources are most helpful when you use them often. They include:  Online chats with a Social worker.  Phone quitlines.  Printed Furniture conservator/restorer.  Support groups or group counseling.  Text messaging programs.  Mobile phone apps. Use apps on your mobile phone or tablet that can help you stick to your quit plan. There are many free apps for mobile phones and tablets as well as websites. Examples include Quit Guide from the [employer] and smokefree.gov  What things can I do to make it easier to quit?   Talk to your family and friends. Ask them to support and encourage you.  Call a phone quitline (1-800-QUIT-NOW), reach out to support groups, or work with a Social worker.  Ask people who smoke to not smoke around you.  Avoid places that make you want to smoke, such as: ? Bars. ? Parties. ? Smoke-break areas at work.  Spend time with people who do not smoke.  Lower the stress in your life. Stress can make you want to smoke. Try  these things to help your stress: ? Getting regular exercise. ? Doing deep-breathing exercises. ? Doing yoga. ? Meditating. ? Doing a body scan. To do this, close your eyes, focus on one area of your body at a time from head to toe. Notice which parts of your body are tense. Try to relax the muscles in those areas. How will I feel when I quit smoking? Day 1 to 3 weeks Within the first 24 hours, you may start to have some problems that come from quitting tobacco. These problems are very bad 2-3 days after you quit, but they do not often last for more than 2-3 weeks. You may get these symptoms:  Mood swings.  Feeling restless, nervous, angry, or annoyed.  Trouble concentrating.  Dizziness.  Strong desire for high-sugar foods and nicotine.  Weight gain.  Trouble pooping (constipation).  Feeling like you may vomit (nausea).  Coughing or a sore throat.  Changes in how the medicines that you take for other issues work in your body.  Depression.  Trouble sleeping (insomnia). Week 3 and afterward After the first 2-3 weeks of quitting, you may start to notice more positive results, such as:  Better sense of smell and taste.  Less coughing and sore throat.  Slower heart rate.  Lower blood pressure.  Clearer skin.  Better breathing.  Fewer sick days. Quitting smoking can be hard. Do not give up if you fail the first time. Some people need to try a few times before they succeed. Do your best to stick to your quit plan, and talk with your doctor if you have any questions or concerns. Summary  Smoking tobacco is the leading cause of preventable death. Quitting smoking can be hard, but it is one of the best things that you can do for your health.  When you decide to quit smoking, make a plan to help you succeed.  Quit smoking right away, not slowly over a period  of time.  When you start quitting, seek help from your doctor, family, or friends. This information is not  intended to replace advice given to you by your health care provider. Make sure you discuss any questions you have with your health care provider. Document Revised: 06/21/2019 Document Reviewed: 12/15/2018 Elsevier Patient Education  [place].  Urinary Frequency, Adult Urinary frequency means urinating more often than usual. You may urinate every 1-2 hours even though you drink a normal amount of fluid and do not have a bladder infection or condition. Although you urinate more often than normal, the total amount of urine produced in a day is normal. With urinary frequency, you may have an urgent need to urinate often. The stress and anxiety of needing to find a bathroom quickly can make this urge worse. This condition may go away on its own or you may need treatment at home. Home treatment may include bladder training, exercises, taking medicines, or making changes to your diet. Follow these instructions at home: Bladder health   Keep a bladder diary if told by your health care provider. Keep track of: ? What you eat and drink. ? How often you urinate. ? How much you urinate.  Follow a bladder training program if told by your health care provider. This may include: ? Learning to delay going to the bathroom. ? Double urinating (voiding). This helps if you are not completely emptying your bladder. ? Scheduled voiding.  Do Kegel exercises as told by your health care provider. Kegel exercises strengthen the muscles that help control urination, which may help the condition. Eating and drinking  If told by your health care provider, make diet changes, such as: ? Avoiding caffeine. ? Drinking fewer fluids, especially alcohol. ? Not drinking in the evening. ? Avoiding foods or drinks that may irritate the bladder. These include coffee, tea, soda, artificial sweeteners, citrus, tomato-based foods, and chocolate. ? Eating foods that help prevent or ease constipation. Constipation can  make this condition worse. Your health care provider may recommend that you:  Drink enough fluid to keep your urine pale yellow.  Take over-the-counter or prescription medicines.  Eat foods that are high in fiber, such as beans, whole grains, and fresh fruits and vegetables.  Limit foods that are high in fat and processed sugars, such as fried or sweet foods. General instructions  Take over-the-counter and prescription medicines only as told by your health care provider.  Keep all follow-up visits as told by your health care provider. This is important. Contact a health care provider if:  You start urinating more often.  You feel pain or irritation when you urinate.  You notice blood in your urine.  Your urine looks cloudy.  You develop a fever.  You begin vomiting. Get help right away if:  You are unable to urinate. Summary  Urinary frequency means urinating more often than usual. With urinary frequency, you may urinate every 1-2 hours even though you drink a normal amount of fluid and do not have a bladder infection or other bladder condition.  Your health care provider may recommend that you keep a bladder diary, follow a bladder training program, or make dietary changes.  If told by your health care provider, do Kegel exercises to strengthen the muscles that help control urination.  Take over-the-counter and prescription medicines only as told by your health care provider.  Contact a health care provider if your symptoms do not improve or get worse. This information is not  intended to replace advice given to you by your health care provider. Make sure you discuss any questions you have with your health care provider. Document Revised: 04/05/2018 Document Reviewed: 04/05/2018 Elsevier Patient Education  [place].

## 2020-02-04 LAB — T-HELPER CELLS (CD4) COUNT (NOT AT ARMC)
Absolute CD4: 491 cells/uL (ref 490–1740)
CD4 T Helper %: 17 % — ABNORMAL LOW (ref 30–61)
Total lymphocyte count: 2919 cells/uL (ref 850–3900)

## 2020-02-05 ENCOUNTER — Encounter: Payer: Self-pay | Admitting: Family Medicine

## 2020-02-05 NOTE — Progress Notes (Signed)
Subjective:     Randall Garrett is a 59 y.o. male and is here for a comprehensive physical exam. The patient reports problems - testicle concern.  Pt feels like his testicles aren't right.  States they feel heavy and like one not two separate testicles.  Pt notes symptoms resolve with masturbation leading to ejaculation.  Pt denies masses, lesions, dysuria, or d/c.  Pt notes nocturia.  Pt smoking cigarettes, 1 ppd.   Pt is planning to take the train and move to [place] in May as he needs a change.  Pt endorses compliance with meds.  Followed by ID.  Social History   Socioeconomic History  . Marital status: Married    Spouse name: Not on file  . Number of children: 0  . Years of education: 67  . Highest education level: Not on file  Occupational History  . Occupation: Retired   Tobacco Use  . Smoking status: Current Every Day Smoker    Packs/day: 1.00    Years: 32.00    Pack years: 32.00    Types: Cigarettes  . Smokeless tobacco: Never Used  . Tobacco comment: going to try to quit this year before 2016  Substance and Sexual Activity  . Alcohol use: Yes    Alcohol/week: 2.0 standard drinks    Types: 2 Standard drinks or equivalent per week    Comment: Occasional  . Drug use: No    Frequency: 7.0 times per week    Types: Marijuana  . Sexual activity: Not Currently    Comment: declined condoms  Other Topics Concern  . Not on file  Social History Narrative   Fun: Exercise and outdoor activity.    Denies religious beliefs effecting health care.    Social Determinants of Health   Financial Resource Strain:   . Difficulty of Paying Living Expenses:   Food Insecurity:   . Worried About Charity fundraiser in the Last Year:   . Arboriculturist in the Last Year:   Transportation Needs:   . Film/video editor (Medical):   Marland Kitchen Lack of Transportation (Non-Medical):   Physical Activity:   . Days of Exercise per Week:   . Minutes of Exercise per Session:   Stress:   .  Feeling of Stress :   Social Connections:   . Frequency of Communication with Friends and Family:   . Frequency of Social Gatherings with Friends and Family:   . Attends Religious Services:   . Active Member of Clubs or Organizations:   . Attends Archivist Meetings:   Marland Kitchen Marital Status:   Intimate Partner Violence:   . Fear of Current or Ex-Partner:   . Emotionally Abused:   Marland Kitchen Physically Abused:   . Sexually Abused:    Health Maintenance  Topic Date Due  . COVID-19 Vaccine (1) Never done  . COLONOSCOPY  Never done  . INFLUENZA VACCINE  05/10/2020  . TETANUS/TDAP  11/15/2022  . Hepatitis C Screening  Completed  . HIV Screening  Completed    The following portions of the patient's history were reviewed and updated as appropriate: allergies, current medications, past family history, past medical history, past social history, past surgical history and problem list.  Review of Systems Pertinent items noted in HPI and remainder of comprehensive ROS otherwise negative.   Objective:    BP 102/74 (BP Location: Left Arm, Patient Position: Sitting, Cuff Size: Large)   Pulse 60   Temp 97.7 F (36.5  C) (Temporal)   Wt 246 lb (111.6 kg)   SpO2 98%   BMI 33.36 kg/m  General appearance: alert, cooperative and no distress Head: Normocephalic, without obvious abnormality, atraumatic Eyes: conjunctivae/corneas clear. PERRL, EOM's intact. Fundi benign. Ears: normal TM's and external ear canals both ears Nose: Nares normal. Septum midline. Mucosa normal. No drainage or sinus tenderness. Throat: lips, mucosa, and tongue normal; teeth and gums normal Neck: no adenopathy, no carotid bruit, no JVD, supple, symmetrical, trachea midline and thyroid not enlarged, symmetric, no tenderness/mass/nodules Lungs: clear to auscultation bilaterally Heart: regular rate and rhythm, S1, S2 normal, no murmur, click, rub or gallop Abdomen: soft, non-tender; bowel sounds normal; no masses,  no  organomegaly Male genitalia: normal, penis: no lesions or discharge. testes: no masses or tenderness. no hernias, uncircumcised Rectal: pt declined exam Extremities: extremities normal, atraumatic, no cyanosis or edema Pulses: 2+ and symmetric Skin: Skin color, texture, turgor normal. No rashes or lesions Lymph nodes: Cervical, supraclavicular, and axillary nodes normal. Neurologic: Alert and oriented X 3, normal strength and tone. Normal symmetric reflexes. Normal coordination and gait    Assessment:    Healthy male exam with urinary frequency.     Plan:     Anticipatory guidance given including wearing seatbelts, smoke detectors in the home, increasing physical activity, increasing p.o. intake of water and vegetables. -will obtain labs -discussed colonoscopy.  Pt hesitant.  Willing to do cologuard. -pt declined prostate exam. -given handout -next CPE in 1 yr. See After Visit Summary for Counseling Recommendations    Mixed hyperlipidemia  -continue lifestyle modifications - Plan: Lipid panel  Cigarette nicotine dependence without complication  -Smoking cessation counseling greater than 3 minutes, less than 10 minutes -Pt smoking 1 pack/day -Discussed cutting down and other ways to help patient quit -Pt considering.  given handout -Continue to monitor - Plan: CBC with Differential/Platelet  Asymptomatic HIV infection (HCC)  -Continue Descovy 200-25 mg daily and Tivicay 50 mg daily -pt advised to contact med assistance program to see if he will be able to get his meds if he moves to [place] -continue f/u with ID - Plan: Comprehensive metabolic panel, CD4 Count  Urinary frequency -Discussed possible causes including BPH, UTI, prostatitis, mass in prostate -pt declined prostate exam -will obtain labs - Plan: Hemoglobin A1c, PSA, POCT Urinalysis Dipstick (Automated)  Screening for prostate cancer  - Plan: PSA  Colon cancer screening  - Plan: Cologuard  F/u  prn  Abbe Amsterdam, MD

## 2020-04-07 ENCOUNTER — Other Ambulatory Visit: Payer: Self-pay | Admitting: Internal Medicine

## 2020-04-07 DIAGNOSIS — B20 Human immunodeficiency virus [HIV] disease: Secondary | ICD-10-CM

## 2020-04-30 ENCOUNTER — Other Ambulatory Visit: Payer: Self-pay

## 2020-04-30 ENCOUNTER — Other Ambulatory Visit: Payer: Federal, State, Local not specified - PPO

## 2020-04-30 DIAGNOSIS — Z21 Asymptomatic human immunodeficiency virus [HIV] infection status: Secondary | ICD-10-CM

## 2020-05-01 LAB — T-HELPER CELL (CD4) - (RCID CLINIC ONLY)
CD4 % Helper T Cell: 14 % — ABNORMAL LOW (ref 33–65)
CD4 T Cell Abs: 430 /uL (ref 400–1790)

## 2020-05-07 LAB — COMPREHENSIVE METABOLIC PANEL
AG Ratio: 1.5 (calc) (ref 1.0–2.5)
ALT: 10 U/L (ref 9–46)
AST: 12 U/L (ref 10–35)
Albumin: 4.2 g/dL (ref 3.6–5.1)
Alkaline phosphatase (APISO): 76 U/L (ref 35–144)
BUN: 10 mg/dL (ref 7–25)
CO2: 23 mmol/L (ref 20–32)
Calcium: 8.9 mg/dL (ref 8.6–10.3)
Chloride: 104 mmol/L (ref 98–110)
Creat: 0.99 mg/dL (ref 0.70–1.33)
Globulin: 2.8 g/dL (calc) (ref 1.9–3.7)
Glucose, Bld: 82 mg/dL (ref 65–99)
Potassium: 4.6 mmol/L (ref 3.5–5.3)
Sodium: 135 mmol/L (ref 135–146)
Total Bilirubin: 0.4 mg/dL (ref 0.2–1.2)
Total Protein: 7 g/dL (ref 6.1–8.1)

## 2020-05-07 LAB — LIPID PANEL
Cholesterol: 207 mg/dL — ABNORMAL HIGH (ref <200)
HDL: 39 mg/dL — ABNORMAL LOW (ref >=40)
LDL Cholesterol (Calc): 139 mg/dL (calc) — ABNORMAL HIGH
Non-HDL Cholesterol (Calc): 168 mg/dL (calc) — ABNORMAL HIGH (ref <130)
Total CHOL/HDL Ratio: 5.3 (calc) — ABNORMAL HIGH (ref <5.0)
Triglycerides: 158 mg/dL — ABNORMAL HIGH (ref <150)

## 2020-05-07 LAB — CBC
HCT: 47.3 % (ref 38.5–50.0)
Hemoglobin: 16.7 g/dL (ref 13.2–17.1)
MCH: 30.7 pg (ref 27.0–33.0)
MCHC: 35.3 g/dL (ref 32.0–36.0)
MCV: 86.9 fL (ref 80.0–100.0)
MPV: 9.9 fL (ref 7.5–12.5)
Platelets: 224 10*3/uL (ref 140–400)
RBC: 5.44 10*6/uL (ref 4.20–5.80)
RDW: 15.1 % — ABNORMAL HIGH (ref 11.0–15.0)
WBC: 7.8 10*3/uL (ref 3.8–10.8)

## 2020-05-07 LAB — RPR: RPR Ser Ql: NONREACTIVE

## 2020-05-07 LAB — HIV-1 RNA QUANT-NO REFLEX-BLD
HIV 1 RNA Quant: <20 copies/mL
HIV-1 RNA Quant, Log: <1.3 Log copies/mL

## 2020-05-18 ENCOUNTER — Ambulatory Visit (INDEPENDENT_AMBULATORY_CARE_PROVIDER_SITE_OTHER): Payer: Federal, State, Local not specified - PPO | Admitting: Internal Medicine

## 2020-05-18 ENCOUNTER — Encounter: Payer: Self-pay | Admitting: Internal Medicine

## 2020-05-18 ENCOUNTER — Other Ambulatory Visit: Payer: Self-pay

## 2020-05-18 DIAGNOSIS — F17210 Nicotine dependence, cigarettes, uncomplicated: Secondary | ICD-10-CM | POA: Diagnosis not present

## 2020-05-18 DIAGNOSIS — F319 Bipolar disorder, unspecified: Secondary | ICD-10-CM | POA: Diagnosis not present

## 2020-05-18 DIAGNOSIS — Z21 Asymptomatic human immunodeficiency virus [HIV] infection status: Secondary | ICD-10-CM

## 2020-05-18 NOTE — Progress Notes (Signed)
Patient Active Problem List   Diagnosis Date Noted  . HIV (human immunodeficiency virus infection) (HCC) 05/13/2011    Priority: High  . Schizo affective schizophrenia (HCC) 05/13/2011    Priority: High  . Mixed hyperlipidemia 09/19/2019  . Obesity 05/01/2019  . Chronic right shoulder pain 12/08/2017  . Erectile dysfunction due to diseases classified elsewhere 12/08/2017  . Routine general medical examination at a health care facility 06/23/2015  . Dyslipidemia 01/08/2014  . Cigarette nicotine dependence without complication 05/26/2011  . Bipolar 1 disorder (HCC) 05/13/2011    Patient's Medications  New Prescriptions   No medications on file  Previous Medications   CIPROFLOXACIN (CIPRO) 500 MG TABLET    Take 1 tablet (500 mg total) by mouth 2 (two) times daily.   DESCOVY 200-25 MG TABLET    TAKE 1 TABLET BY MOUTH EVERY DAY   DICLOFENAC SODIUM (PENNSAID) 2 % SOLN    Place 1 application onto the skin 2 (two) times daily.   HYDROCORTISONE 2.5 % CREAM    Apply topically.   OLANZAPINE (ZYPREXA) 20 MG TABLET    Take 2 tablets (40 mg total) by mouth at bedtime.   TIVICAY 50 MG TABLET    TAKE 1 TABLET BY MOUTH EVERY DAY   TRIAMCINOLONE CREAM (KENALOG) 0.1 %    Apply 1 application topically 2 (two) times daily.  Modified Medications   No medications on file  Discontinued Medications   No medications on file    Subjective: Randall Garrett is in for his routine HIV follow-up visit.  He has not had any problems obtaining, taking or tolerating his Descovy or Tivicay and does not recall missing any doses.  He received his 2 Pfizer Covid vaccines earlier this year.  He still smoking cigarettes.  He is planning on marrying his longtime partner, Randall Garrett, later this year.  He denies feeling unusually sad or anxious during the pandemic.   Review of Systems: Review of Systems  Constitutional: Negative for chills, diaphoresis, fever, malaise/fatigue and weight loss.  HENT: Negative for sore  throat.   Respiratory: Positive for cough. Negative for sputum production and shortness of breath.   Cardiovascular: Negative for chest pain.  Gastrointestinal: Negative for abdominal pain, diarrhea, heartburn, nausea and vomiting.  Genitourinary: Negative for dysuria and frequency.  Musculoskeletal: Negative for joint pain and myalgias.  Skin: Negative for rash.  Neurological: Negative for dizziness and headaches.  Psychiatric/Behavioral: Negative for depression and substance abuse. The patient is not nervous/anxious.     Past Medical History:  Diagnosis Date  . Bipolar 1 disorder (HCC) 05/13/2011  . Cigarette smoker 05/26/2011   Have asked him to continue consideration of quitting smoking completely.   . Depression   . HIV (human immunodeficiency virus infection) (HCC) 05/13/2011  . HIV infection (HCC)   . Rash, skin 05/13/2011   Bilateral arm    . Schizo affective schizophrenia (HCC) 05/13/2011    Social History   Tobacco Use  . Smoking status: Current Every Day Smoker    Packs/day: 1.00    Years: 32.00    Pack years: 32.00    Types: Cigarettes  . Smokeless tobacco: Never Used  . Tobacco comment: going to try to quit this year before 2016  Vaping Use  . Vaping Use: Never used  Substance Use Topics  . Alcohol use: Yes    Alcohol/week: 1.0 standard drink    Types: 1 Standard drinks or equivalent per week  Comment: once every 3 months  . Drug use: No    Frequency: 7.0 times per week    Types: Marijuana    Family History  Problem Relation Age of Onset  . Diabetes Mother   . Hypertension Mother   . Hyperlipidemia Mother   . Stroke Mother   . Diabetes Father   . Hypertension Father   . Hyperlipidemia Father   . Diabetes Sister   . Hypertension Sister   . Hyperlipidemia Sister   . Stroke Sister   . Diabetes Brother   . Hypertension Brother   . Hyperlipidemia Brother   . Cancer Maternal Grandmother     No Known Allergies  Health Maintenance  Topic Date Due  .  COVID-19 Vaccine (1) Never done  . COLONOSCOPY  Never done  . INFLUENZA VACCINE  05/10/2020  . TETANUS/TDAP  11/15/2022  . Hepatitis C Screening  Completed  . HIV Screening  Completed    Objective:  Vitals:   05/18/20 1106  BP: 122/83  Pulse: 77  SpO2: 96%  Weight: 234 lb (106.1 kg)   Body mass index is 31.74 kg/m.  Physical Exam Constitutional:      Comments: He is talkative and in good spirits as usual.  His weight is down 20 pounds over the past year.  Cardiovascular:     Rate and Rhythm: Normal rate and regular rhythm.     Heart sounds: No murmur heard.   Pulmonary:     Effort: Pulmonary effort is normal.     Breath sounds: Normal breath sounds.  Abdominal:     General: There is no distension.     Palpations: Abdomen is soft.     Tenderness: There is no abdominal tenderness.  Musculoskeletal:        General: No swelling or tenderness.  Skin:    Findings: No rash.  Neurological:     General: No focal deficit present.  Psychiatric:        Mood and Affect: Mood normal.     Lab Results Lab Results  Component Value Date   WBC 7.8 04/30/2020   HGB 16.7 04/30/2020   HCT 47.3 04/30/2020   MCV 86.9 04/30/2020   PLT 224 04/30/2020    Lab Results  Component Value Date   CREATININE 0.99 04/30/2020   BUN 10 04/30/2020   NA 135 04/30/2020   K 4.6 04/30/2020   CL 104 04/30/2020   CO2 23 04/30/2020    Lab Results  Component Value Date   ALT 10 04/30/2020   AST 12 04/30/2020   ALKPHOS 76 02/03/2020   BILITOT 0.4 04/30/2020    Lab Results  Component Value Date   CHOL 207 (H) 04/30/2020   HDL 39 (L) 04/30/2020   LDLCALC 139 (H) 04/30/2020   TRIG 158 (H) 04/30/2020   CHOLHDL 5.3 (H) 04/30/2020   Lab Results  Component Value Date   LABRPR NON-REACTIVE 04/30/2020   HIV 1 RNA Quant (copies/mL)  Date Value  04/30/2020 <20 NOT DETECTED  04/17/2019 <20 NOT DETECTED  10/12/2017 25 (H)   CD4 T Cell Abs (/uL)  Date Value  04/30/2020 430  04/17/2019  405  10/12/2017 470     Problem List Items Addressed This Visit      High   HIV (human immunodeficiency virus infection) (HCC)    His infection remains under excellent, long-term control.  He will continue Descovy and Tivicay and follow-up after lab work in 1 year.      Relevant  Orders   CBC   T-helper cell (CD4)- ([facility] clinic only)   Comprehensive metabolic panel   RPR   Lipid panel   HIV-1 RNA quant-no reflex-bld     Unprioritized   Cigarette nicotine dependence without complication    I encouraged him to consider setting a quit date and attempting to stop smoking.      Bipolar 1 disorder (HCC)    His depression is in remission.           Cliffton Asters, MD [facility] [phone] pager   [phone] cell 05/18/2020, 11:26 AM

## 2020-05-18 NOTE — Assessment & Plan Note (Signed)
His infection remains under excellent, long-term control.  He will continue Descovy and Tivicay and follow-up after lab work in 1 year. 

## 2020-05-18 NOTE — Assessment & Plan Note (Signed)
His depression is in remission. 

## 2020-05-18 NOTE — Assessment & Plan Note (Signed)
I encouraged him to consider setting a quit date and attempting to stop smoking.

## 2020-07-07 ENCOUNTER — Other Ambulatory Visit: Payer: Self-pay | Admitting: Internal Medicine

## 2020-07-07 ENCOUNTER — Other Ambulatory Visit: Payer: Self-pay

## 2020-07-07 DIAGNOSIS — B20 Human immunodeficiency virus [HIV] disease: Secondary | ICD-10-CM

## 2020-07-07 MED ORDER — DESCOVY 200-25 MG PO TABS: 1.0000 | ORAL_TABLET | Freq: Every day | ORAL | 1 refills | Status: DC

## 2020-07-07 MED ORDER — TIVICAY 50 MG PO TABS: 50.0000 mg | ORAL_TABLET | Freq: Every day | ORAL | 1 refills | Status: DC

## 2020-09-17 ENCOUNTER — Ambulatory Visit: Payer: Federal, State, Local not specified - PPO

## 2020-09-21 ENCOUNTER — Ambulatory Visit: Payer: Federal, State, Local not specified - PPO | Attending: Internal Medicine

## 2020-09-21 DIAGNOSIS — Z23 Encounter for immunization: Secondary | ICD-10-CM

## 2020-09-21 NOTE — Progress Notes (Signed)
   Covid-19 Vaccination Clinic  Name:  Randall Garrett    MRN: [id number] DOB: 30-Oct-1960  09/21/2020  [name] was observed post Covid-19 immunization for 15 minutes without incident. He was provided with Vaccine Information Sheet and instruction to access the V-Safe system.   [name] was instructed to call 911 with any severe reactions post vaccine: Marland Kitchen Difficulty breathing  . Swelling of face and throat  . A fast heartbeat  . A bad rash all over body  . Dizziness and weakness   Immunizations Administered    Name Date Dose VIS Date Route   Pfizer COVID-19 Vaccine 09/21/2020  1:54 PM 0.3 mL 07/29/2020 Intramuscular   Manufacturer: [employer], [employer]   Lot: [id number]   NDC: [id number]

## 2020-12-01 ENCOUNTER — Other Ambulatory Visit: Payer: Self-pay | Admitting: Internal Medicine

## 2020-12-01 DIAGNOSIS — B20 Human immunodeficiency virus [HIV] disease: Secondary | ICD-10-CM

## 2021-03-29 ENCOUNTER — Other Ambulatory Visit: Payer: Self-pay

## 2021-03-29 ENCOUNTER — Other Ambulatory Visit: Payer: Self-pay | Admitting: Internal Medicine

## 2021-03-29 DIAGNOSIS — B20 Human immunodeficiency virus [HIV] disease: Secondary | ICD-10-CM

## 2021-03-29 MED ORDER — DESCOVY 200-25 MG PO TABS: 1.0000 | ORAL_TABLET | Freq: Every day | ORAL | 1 refills | Status: DC

## 2021-03-29 MED ORDER — TIVICAY 50 MG PO TABS: 50.0000 mg | ORAL_TABLET | Freq: Every day | ORAL | 1 refills | Status: DC

## 2021-03-31 ENCOUNTER — Other Ambulatory Visit: Payer: Federal, State, Local not specified - PPO

## 2021-03-31 ENCOUNTER — Other Ambulatory Visit: Payer: Self-pay

## 2021-03-31 DIAGNOSIS — Z21 Asymptomatic human immunodeficiency virus [HIV] infection status: Secondary | ICD-10-CM

## 2021-04-01 LAB — T-HELPER CELL (CD4) - (RCID CLINIC ONLY)
CD4 % Helper T Cell: 13 % — ABNORMAL LOW (ref 33–65)
CD4 T Cell Abs: 410 /uL (ref 400–1790)

## 2021-04-02 LAB — CBC
HCT: 47.8 % (ref 38.5–50.0)
Hemoglobin: 16.6 g/dL (ref 13.2–17.1)
MCH: 31.2 pg (ref 27.0–33.0)
MCHC: 34.7 g/dL (ref 32.0–36.0)
MCV: 89.8 fL (ref 80.0–100.0)
MPV: 9.9 fL (ref 7.5–12.5)
Platelets: 255 10*3/uL (ref 140–400)
RBC: 5.32 10*6/uL (ref 4.20–5.80)
RDW: 14.9 % (ref 11.0–15.0)
WBC: 7.7 10*3/uL (ref 3.8–10.8)

## 2021-04-02 LAB — COMPREHENSIVE METABOLIC PANEL
AG Ratio: 1.4 (calc) (ref 1.0–2.5)
ALT: 10 U/L (ref 9–46)
AST: 13 U/L (ref 10–35)
Albumin: 4.2 g/dL (ref 3.6–5.1)
Alkaline phosphatase (APISO): 71 U/L (ref 35–144)
BUN: 8 mg/dL (ref 7–25)
CO2: 24 mmol/L (ref 20–32)
Calcium: 9.3 mg/dL (ref 8.6–10.3)
Chloride: 104 mmol/L (ref 98–110)
Creat: 0.97 mg/dL (ref 0.70–1.33)
Globulin: 2.9 g/dL (calc) (ref 1.9–3.7)
Glucose, Bld: 85 mg/dL (ref 65–99)
Potassium: 4.4 mmol/L (ref 3.5–5.3)
Sodium: 137 mmol/L (ref 135–146)
Total Bilirubin: 0.4 mg/dL (ref 0.2–1.2)
Total Protein: 7.1 g/dL (ref 6.1–8.1)

## 2021-04-02 LAB — HIV-1 RNA QUANT-NO REFLEX-BLD
HIV 1 RNA Quant: NOT DETECTED Copies/mL
HIV-1 RNA Quant, Log: NOT DETECTED Log cps/mL

## 2021-04-02 LAB — LIPID PANEL
Cholesterol: 220 mg/dL — ABNORMAL HIGH (ref <200)
HDL: 44 mg/dL (ref >=40)
LDL Cholesterol (Calc): 154 mg/dL (calc) — ABNORMAL HIGH
Non-HDL Cholesterol (Calc): 176 mg/dL (calc) — ABNORMAL HIGH (ref <130)
Total CHOL/HDL Ratio: 5 (calc) — ABNORMAL HIGH (ref <5.0)
Triglycerides: 106 mg/dL (ref <150)

## 2021-04-14 ENCOUNTER — Other Ambulatory Visit: Payer: Self-pay

## 2021-04-14 ENCOUNTER — Encounter: Payer: Self-pay | Admitting: Internal Medicine

## 2021-04-14 ENCOUNTER — Ambulatory Visit (INDEPENDENT_AMBULATORY_CARE_PROVIDER_SITE_OTHER): Payer: Federal, State, Local not specified - PPO | Admitting: Internal Medicine

## 2021-04-14 ENCOUNTER — Ambulatory Visit (INDEPENDENT_AMBULATORY_CARE_PROVIDER_SITE_OTHER): Payer: Federal, State, Local not specified - PPO

## 2021-04-14 DIAGNOSIS — Z23 Encounter for immunization: Secondary | ICD-10-CM

## 2021-04-14 DIAGNOSIS — F259 Schizoaffective disorder, unspecified: Secondary | ICD-10-CM

## 2021-04-14 DIAGNOSIS — B20 Human immunodeficiency virus [HIV] disease: Secondary | ICD-10-CM

## 2021-04-14 DIAGNOSIS — E785 Hyperlipidemia, unspecified: Secondary | ICD-10-CM | POA: Diagnosis not present

## 2021-04-14 MED ORDER — TIVICAY 50 MG PO TABS: 50.0000 mg | ORAL_TABLET | Freq: Every day | ORAL | 11 refills | Status: DC

## 2021-04-14 MED ORDER — DESCOVY 200-25 MG PO TABS: 1.0000 | ORAL_TABLET | Freq: Every day | ORAL | 11 refills | Status: DC

## 2021-04-14 NOTE — Assessment & Plan Note (Signed)
We have given him the contact information for Dr. Abbe Amsterdam and encouraged him to schedule a visit with her as soon as possible for routine care.

## 2021-04-14 NOTE — Progress Notes (Signed)
   Covid-19 Vaccination Clinic  Name:  Yassine Brunsman    MRN: [id number] DOB: 21-Jul-1961  04/14/2021  Mr. Tatham was observed post Covid-19 immunization for 15 minutes without incident. He was provided with Vaccine Information Sheet and instruction to access the V-Safe system.   Mr. Silvio was instructed to call 911 with any severe reactions post vaccine: Difficulty breathing  Swelling of face and throat  A fast heartbeat  A bad rash all over body  Dizziness and weakness   Immunizations Administered     Name Date Dose VIS Date Route   PFIZER Comrnaty(Gray TOP) Covid-19 Vaccine 04/14/2021 11:15 AM 0.3 mL 09/17/2020 Intramuscular   Manufacturer: [employer], [employer]   Lot: [id number]   NDC: [phone]       Marico Buckle Lesli Albee, CMA

## 2021-04-14 NOTE — Progress Notes (Signed)
Patient Active Problem List   Diagnosis Date Noted   HIV (human immunodeficiency virus infection) (HCC) 05/13/2011    Priority: High   Schizo affective schizophrenia (HCC) 05/13/2011    Priority: High   Mixed hyperlipidemia 09/19/2019   Obesity 05/01/2019   Chronic right shoulder pain 12/08/2017   Erectile dysfunction due to diseases classified elsewhere 12/08/2017   Routine general medical examination at a health care facility 06/23/2015   Dyslipidemia 01/08/2014   Cigarette nicotine dependence without complication 05/26/2011   Bipolar 1 disorder (HCC) 05/13/2011    Patient's Medications  New Prescriptions   No medications on file  Previous Medications   CIPROFLOXACIN (CIPRO) 500 MG TABLET    Take 1 tablet (500 mg total) by mouth 2 (two) times daily.   DICLOFENAC SODIUM (PENNSAID) 2 % SOLN    Place 1 application onto the skin 2 (two) times daily.   HYDROCORTISONE 2.5 % CREAM    Apply topically.   OLANZAPINE (ZYPREXA) 20 MG TABLET    Take 2 tablets (40 mg total) by mouth at bedtime.   TRIAMCINOLONE CREAM (KENALOG) 0.1 %    Apply 1 application topically 2 (two) times daily.  Modified Medications   Modified Medication Previous Medication   DOLUTEGRAVIR (TIVICAY) 50 MG TABLET dolutegravir (TIVICAY) 50 MG tablet      Take 1 tablet (50 mg total) by mouth daily.    Take 1 tablet (50 mg total) by mouth daily.   EMTRICITABINE-TENOFOVIR AF (DESCOVY) 200-25 MG TABLET emtricitabine-tenofovir AF (DESCOVY) 200-25 MG tablet      Take 1 tablet by mouth daily.    Take 1 tablet by mouth daily.  Discontinued Medications   No medications on file    Subjective: Randall Garrett is in for his routine HIV follow-up visit.  He denies any problems obtaining, taking or tolerating his Descovy or Tivicay and has not missed doses.  He takes it each morning when he first gets up.  He has not been traveling in the past few years because of the pandemic but he has been going to lots of concerts at the  [facility]  He had his first COVID booster in early December of last year.  He wants to get his second a booster now.  He says that his mood has been up and down recently.  He goes to [facility] to get his medications but does not receive counseling there.  He has not seen his PCP recently.  He thinks he is due for a colonoscopy.  He has a very mild rash on his left hand and arm.  Review of Systems: Review of Systems  Constitutional:  Negative for fever and weight loss.  Respiratory:  Negative for cough and shortness of breath.   Cardiovascular:  Negative for chest pain.  Gastrointestinal:  Negative for abdominal pain, diarrhea, nausea and vomiting.  Skin:  Positive for rash. Negative for itching.  Psychiatric/Behavioral:  Positive for depression. The patient is not nervous/anxious.    Past Medical History:  Diagnosis Date   Bipolar 1 disorder (HCC) 05/13/2011   Cigarette smoker 05/26/2011   Have asked him to continue consideration of quitting smoking completely.    Depression    HIV (human immunodeficiency virus infection) (HCC) 05/13/2011   HIV infection (HCC)    Rash, skin 05/13/2011   Bilateral arm     Schizo affective schizophrenia (HCC) 05/13/2011    Social History   Tobacco Use   Smoking status: Every  Day    Packs/day: 1.00    Years: 32.00    Pack years: 32.00    Types: Cigarettes   Smokeless tobacco: Never   Tobacco comments:    going to try to quit this year before 2016  Vaping Use   Vaping Use: Never used  Substance Use Topics   Alcohol use: Yes    Alcohol/week: 1.0 standard drink    Types: 1 Standard drinks or equivalent per week    Comment: once every 3 months   Drug use: No    Frequency: 7.0 times per week    Types: Marijuana    Family History  Problem Relation Age of Onset   Diabetes Mother    Hypertension Mother    Hyperlipidemia Mother    Stroke Mother    Diabetes Father    Hypertension Father    Hyperlipidemia Father    Diabetes Sister    Hypertension  Sister    Hyperlipidemia Sister    Stroke Sister    Diabetes Brother    Hypertension Brother    Hyperlipidemia Brother    Cancer Maternal Grandmother     No Known Allergies  Health Maintenance  Topic Date Due   Pneumococcal Vaccine 96-39 Years old (1 - PCV) Never done   Zoster Vaccines- Shingrix (1 of 2) Never done   COLONOSCOPY (Pts 45-60yrs Insurance coverage will need to be confirmed)  Never done   COVID-19 Vaccine (2 - Pfizer risk series) 10/12/2020   INFLUENZA VACCINE  05/10/2021   TETANUS/TDAP  11/15/2022   Hepatitis C Screening  Completed   HIV Screening  Completed   HPV VACCINES  Aged Out    Objective:  Vitals:   04/14/21 1032  BP: 117/82  Pulse: 83  Temp: 98.5 F (36.9 C)  TempSrc: Oral  Weight: 233 lb 12.8 oz (106.1 kg)   Body mass index is 31.71 kg/m.  Physical Exam Constitutional:      Comments: He is talkative and in good spirits as usual.  Cardiovascular:     Rate and Rhythm: Normal rate and regular rhythm.     Heart sounds: No murmur heard. Pulmonary:     Effort: Pulmonary effort is normal.     Breath sounds: Normal breath sounds.  Skin:    Comments: He points to a rash on his left hand but I cannot appreciate any changes.  Psychiatric:        Mood and Affect: Mood normal.    Lab Results Lab Results  Component Value Date   WBC 7.7 03/31/2021   HGB 16.6 03/31/2021   HCT 47.8 03/31/2021   MCV 89.8 03/31/2021   PLT 255 03/31/2021    Lab Results  Component Value Date   CREATININE 0.97 03/31/2021   BUN 8 03/31/2021   NA 137 03/31/2021   K 4.4 03/31/2021   CL 104 03/31/2021   CO2 24 03/31/2021    Lab Results  Component Value Date   ALT 10 03/31/2021   AST 13 03/31/2021   ALKPHOS 76 02/03/2020   BILITOT 0.4 03/31/2021    Lab Results  Component Value Date   CHOL 220 (H) 03/31/2021   HDL 44 03/31/2021   LDLCALC 154 (H) 03/31/2021   TRIG 106 03/31/2021   CHOLHDL 5.0 (H) 03/31/2021   Lab Results  Component Value Date   LABRPR  NON-REACTIVE 04/30/2020   HIV 1 RNA Quant  Date Value  03/31/2021 Not Detected Copies/mL  04/30/2020 <20 NOT DETECTED copies/mL  04/17/2019 <20 NOT  DETECTED copies/mL   CD4 T Cell Abs (/uL)  Date Value  03/31/2021 410  04/30/2020 430  04/17/2019 405     Problem List Items Addressed This Visit       High   HIV (human immunodeficiency virus infection) (HCC)    His infection has been under excellent, long-term control.  I told him that there is nothing safer, simpler or more effective than his current regimen.  He will continue Descovy and Tivicay and follow-up after lab work in 1 year.  He received his second COVID booster today.       Relevant Medications   dolutegravir (TIVICAY) 50 MG tablet   emtricitabine-tenofovir AF (DESCOVY) 200-25 MG tablet   Schizo affective schizophrenia (HCC)    We will schedule him to have a visit with our behavioral health counselor.       Relevant Orders   CBC   T-helper cell (CD4)- ([facility] clinic only)   Comprehensive metabolic panel   Lipid panel   RPR   HIV-1 RNA quant-no reflex-bld     Unprioritized   Dyslipidemia    We have given him the contact information for Dr. Abbe Amsterdam and encouraged him to schedule a visit with her as soon as possible for routine care.          Cliffton Asters, MD [facility] [phone] pager   [phone] cell 04/14/2021, 11:08 AM

## 2021-04-14 NOTE — Assessment & Plan Note (Signed)
We will schedule him to have a visit with our behavioral health counselor.

## 2021-04-14 NOTE — Assessment & Plan Note (Signed)
His infection has been under excellent, long-term control.  I told him that there is nothing safer, simpler or more effective than his current regimen.  He will continue Descovy and Tivicay and follow-up after lab work in 1 year.  He received his second COVID booster today.

## 2021-05-03 ENCOUNTER — Other Ambulatory Visit: Payer: Self-pay

## 2021-05-03 ENCOUNTER — Encounter: Payer: Self-pay | Admitting: Family Medicine

## 2021-05-03 ENCOUNTER — Ambulatory Visit (INDEPENDENT_AMBULATORY_CARE_PROVIDER_SITE_OTHER): Payer: Federal, State, Local not specified - PPO | Admitting: Family Medicine

## 2021-05-03 VITALS — BP 122/84 | HR 77 | Temp 97.9°F | Ht 73.0 in | Wt 233.6 lb

## 2021-05-03 DIAGNOSIS — Z125 Encounter for screening for malignant neoplasm of prostate: Secondary | ICD-10-CM | POA: Diagnosis not present

## 2021-05-03 DIAGNOSIS — E782 Mixed hyperlipidemia: Secondary | ICD-10-CM

## 2021-05-03 DIAGNOSIS — F17210 Nicotine dependence, cigarettes, uncomplicated: Secondary | ICD-10-CM

## 2021-05-03 DIAGNOSIS — Z0000 Encounter for general adult medical examination without abnormal findings: Secondary | ICD-10-CM | POA: Diagnosis not present

## 2021-05-03 DIAGNOSIS — L308 Other specified dermatitis: Secondary | ICD-10-CM

## 2021-05-03 DIAGNOSIS — L309 Dermatitis, unspecified: Secondary | ICD-10-CM | POA: Insufficient documentation

## 2021-05-03 DIAGNOSIS — R350 Frequency of micturition: Secondary | ICD-10-CM

## 2021-05-03 DIAGNOSIS — Z21 Asymptomatic human immunodeficiency virus [HIV] infection status: Secondary | ICD-10-CM

## 2021-05-03 DIAGNOSIS — F1010 Alcohol abuse, uncomplicated: Secondary | ICD-10-CM | POA: Diagnosis not present

## 2021-05-03 DIAGNOSIS — F259 Schizoaffective disorder, unspecified: Secondary | ICD-10-CM

## 2021-05-03 DIAGNOSIS — Z1211 Encounter for screening for malignant neoplasm of colon: Secondary | ICD-10-CM

## 2021-05-03 LAB — CBC WITH DIFFERENTIAL/PLATELET
Basophils Absolute: 0.1 10*3/uL (ref 0.0–0.1)
Basophils Relative: 1.1 % (ref 0.0–3.0)
Eosinophils Absolute: 0.1 10*3/uL (ref 0.0–0.7)
Eosinophils Relative: 0.8 % (ref 0.0–5.0)
HCT: 46.1 % (ref 39.0–52.0)
Hemoglobin: 15.8 g/dL (ref 13.0–17.0)
Lymphocytes Relative: 41.1 % (ref 12.0–46.0)
Lymphs Abs: 3 10*3/uL (ref 0.7–4.0)
MCHC: 34.3 g/dL (ref 30.0–36.0)
MCV: 92.1 fl (ref 78.0–100.0)
Monocytes Absolute: 0.6 10*3/uL (ref 0.1–1.0)
Monocytes Relative: 8.7 % (ref 3.0–12.0)
Neutro Abs: 3.5 10*3/uL (ref 1.4–7.7)
Neutrophils Relative %: 48.3 % (ref 43.0–77.0)
Platelets: 260 10*3/uL (ref 150.0–400.0)
RBC: 5.01 Mil/uL (ref 4.22–5.81)
RDW: 15.6 % — ABNORMAL HIGH (ref 11.5–15.5)
WBC: 7.2 10*3/uL (ref 4.0–10.5)

## 2021-05-03 LAB — LIPID PANEL
Cholesterol: 215 mg/dL — ABNORMAL HIGH (ref 0–200)
HDL: 42.5 mg/dL (ref >39.00)
LDL Cholesterol: 152 mg/dL — ABNORMAL HIGH (ref 0–99)
NonHDL: 172.44
Total CHOL/HDL Ratio: 5
Triglycerides: 103 mg/dL (ref 0.0–149.0)
VLDL: 20.6 mg/dL (ref 0.0–40.0)

## 2021-05-03 LAB — POCT URINALYSIS DIPSTICK
Bilirubin, UA: NEGATIVE
Blood, UA: NEGATIVE
Glucose, UA: NEGATIVE
Ketones, UA: NEGATIVE
Leukocytes, UA: NEGATIVE
Nitrite, UA: NEGATIVE
Protein, UA: POSITIVE — AB
Spec Grav, UA: 1.025 (ref 1.010–1.025)
Urobilinogen, UA: NEGATIVE E.U./dL — AB
pH, UA: 6 (ref 5.0–8.0)

## 2021-05-03 LAB — COMPREHENSIVE METABOLIC PANEL
ALT: 9 U/L (ref 0–53)
AST: 11 U/L (ref 0–37)
Albumin: 4.1 g/dL (ref 3.5–5.2)
Alkaline Phosphatase: 72 U/L (ref 39–117)
BUN: 9 mg/dL (ref 6–23)
CO2: 26 mEq/L (ref 19–32)
Calcium: 9.2 mg/dL (ref 8.4–10.5)
Chloride: 102 mEq/L (ref 96–112)
Creatinine, Ser: 1.1 mg/dL (ref 0.40–1.50)
GFR: 73.57 mL/min (ref >60.00)
Glucose, Bld: 84 mg/dL (ref 70–99)
Potassium: 4.4 mEq/L (ref 3.5–5.1)
Sodium: 136 mEq/L (ref 135–145)
Total Bilirubin: 0.4 mg/dL (ref 0.2–1.2)
Total Protein: 7 g/dL (ref 6.0–8.3)

## 2021-05-03 LAB — VITAMIN B12: Vitamin B-12: 143 pg/mL — ABNORMAL LOW (ref 211–911)

## 2021-05-03 LAB — T4, FREE: Free T4: 0.76 ng/dL (ref 0.60–1.60)

## 2021-05-03 LAB — HEMOGLOBIN A1C: Hgb A1c MFr Bld: 6 % (ref 4.6–6.5)

## 2021-05-03 LAB — PSA: PSA: 0.54 ng/mL (ref 0.10–4.00)

## 2021-05-03 LAB — FOLATE: Folate: 3.9 ng/mL — ABNORMAL LOW (ref >5.9)

## 2021-05-03 LAB — TSH: TSH: 3.19 u[IU]/mL (ref 0.35–5.50)

## 2021-05-03 MED ORDER — TRIAMCINOLONE ACETONIDE 0.1 % EX CREA: 1.0000 "application " | TOPICAL_CREAM | Freq: Two times a day (BID) | CUTANEOUS | 0 refills | Status: DC

## 2021-05-03 NOTE — Progress Notes (Signed)
Subjective:     Randall Garrett is a 60 y.o. male and is here for a comprehensive physical exam. The patient reports rash on upper eyelid and hand.  Using bar soap to clean face, taking hot showers. Using Aveno lotion.  Inquires about colonoscopy, never received cologuard.  Randall [place] to [place] and has been enjoying concerts this yr.  Considering learning to play pickle ball.  Used to play tennis.  Endorses weight gain.  Drinking alcohol, mostly tall cans of beer, 3+ drinks per day.    Social History   Socioeconomic History   Marital status: Married    Spouse name: Not on file   Number of children: 0   Years of education: 16   Highest education level: Not on file  Occupational History   Occupation: Retired   Tobacco Use   Smoking status: Every Day    Packs/day: 1.00    Years: 32.00    Pack years: 32.00    Types: Cigarettes   Smokeless tobacco: Never   Tobacco comments:    going to try to quit this year before 2016  Vaping Use   Vaping Use: Never used  Substance and Sexual Activity   Alcohol use: Yes    Alcohol/week: 1.0 standard drink    Types: 1 Standard drinks or equivalent per week    Comment: once every 3 months   Drug use: No    Frequency: 7.0 times per week    Types: Marijuana   Sexual activity: Not Currently    Comment: declined condoms  Other Topics Concern   Not on file  Social History Narrative   Fun: Exercise and outdoor activity.    Denies religious beliefs effecting health care.    Social Determinants of Health   Financial Resource Strain: Not on file  Food Insecurity: Not on file  Transportation Needs: Not on file  Physical Activity: Not on file  Stress: Not on file  Social Connections: Not on file  Intimate Partner Violence: Not on file   Health Maintenance  Topic Date Due   Zoster Vaccines- Shingrix (1 of 2) Never done   COLONOSCOPY (Pts 45-52yrs Insurance coverage will need to be confirmed)  Never done   Pneumococcal Vaccine 58-17 Years old (2 - PCV)  12/30/2009   INFLUENZA VACCINE  05/10/2021   COVID-19 Vaccine (3 - Pfizer risk series) 05/12/2021   TETANUS/TDAP  11/15/2022   Hepatitis C Screening  Completed   HIV Screening  Completed   HPV VACCINES  Aged Out    The following portions of the patient's history were reviewed and updated as appropriate: allergies, current medications, past family history, past medical history, past social history, past surgical history, and problem list.  Review of Systems Pertinent items noted in HPI and remainder of comprehensive ROS otherwise negative.   Objective:    BP 122/84 (BP Location: Right Arm, Patient Position: Sitting, Cuff Size: Normal)   Pulse 77   Temp 97.9 F (36.6 C) (Oral)   Ht 6\' 1"  (1.854 m)   Wt 233 lb 9.6 oz (106 kg)   SpO2 95%   BMI 30.82 kg/m  General appearance: alert, cooperative, and no distress Head: Normocephalic, without obvious abnormality, atraumatic Eyes: conjunctivae/corneas clear. PERRL, EOM's intact. Fundi benign. Ears: Normal external ears.  Cerumen occluding b/l canals Nose: Nares normal. Septum midline. Mucosa normal. No drainage or sinus tenderness. Throat: lips, mucosa, and tongue normal; teeth and gums normal Neck: no adenopathy, no carotid bruit, no JVD, supple, symmetrical, trachea  midline, and thyroid not enlarged, symmetric, no tenderness/mass/nodules Lungs: clear to auscultation bilaterally Heart: regular rate and rhythm, S1, S2 normal, no murmur, click, rub or gallop Abdomen: soft, non-tender; bowel sounds normal; no masses,  no organomegaly Extremities: extremities normal, atraumatic, no cyanosis or edema Pulses: 2+ and symmetric Skin: Skin color, texture, turgor normal.  Eczematous rash on dorsum of left hand.  Dry appearing fine papules of R upper eye lid and inferior brow. Lymph nodes: Cervical, supraclavicular, and axillary nodes normal. Neurologic: Alert and oriented X 3, normal strength and tone. Normal symmetric reflexes. Normal  coordination and gait    Assessment:    Healthy male exam.      Plan:    Anticipatory guidance given including wearing seatbelts, smoke detectors in the home, increasing physical activity, increasing p.o. intake of water and vegetables. -obtain labs See After Visit Summary for Counseling Recommendations   Urinary frequency  -likely 2/2  BPH.  Consider constipation or infection -obtain UA and PSA - Plan: PSA, POCT urinalysis dipstick  ETOH abuse  -Discussed cutting down on alcohol intake.  Patient considering as does not see an issue. -Continue to monitor - Plan: CMP, Folate, Vitamin B12, CBC with Differential/Platelet  Asymptomatic HIV infection (HCC)  -stable on tivicay 50 mg and descovy 200-25 mg -continue f/u with ID - Plan: CBC with Differential/Platelet  Schizo affective schizophrenia (HCC) -stable -PHQ-9 score 4 -GAD-7 score 7 -Not currently on medication -consider counseling -Continue to monitor  Mixed hyperlipidemia  -Total cholesterol 220, LDL 154 -Lifestyle modifications -Discussed medication options. - Plan: Lipid panel  Cigarette nicotine dependence without complication  -Current smoker, 1 ppd x 32 yrs -Smoking cessation greater than 3 minutes, less than 10 minutes -Patient encouraged to cut down -Contemplating -Discussed various quit aids -Continue to monitor -Low-dose CT scan for lung cancer screening - Plan: CT CHEST LUNG CA SCREEN LOW DOSE W/O CM  Colon cancer screening  - Plan: Cologuard  Prostate cancer screening -Declined DRE - Plan: PSA  Other eczema -triamcinolone cream on hands -Avoid hot showers.  Use a gentle moisturizer and soap. -Plan: Kenalog 0.1% cream  F/u in 4-6 months, sooner if needed  Abbe Amsterdam, MD

## 2021-05-04 ENCOUNTER — Other Ambulatory Visit: Payer: Self-pay | Admitting: Family Medicine

## 2021-05-04 ENCOUNTER — Ambulatory Visit: Payer: Federal, State, Local not specified - PPO

## 2021-05-04 DIAGNOSIS — E538 Deficiency of other specified B group vitamins: Secondary | ICD-10-CM

## 2021-05-04 MED ORDER — FOLIC ACID 400 MCG PO TABS: 400.0000 ug | ORAL_TABLET | Freq: Every day | ORAL | 3 refills | Status: DC

## 2021-06-10 ENCOUNTER — Ambulatory Visit: Payer: Self-pay

## 2021-07-14 ENCOUNTER — Ambulatory Visit (INDEPENDENT_AMBULATORY_CARE_PROVIDER_SITE_OTHER): Payer: Federal, State, Local not specified - PPO

## 2021-07-14 ENCOUNTER — Telehealth: Payer: Self-pay

## 2021-07-14 ENCOUNTER — Other Ambulatory Visit: Payer: Self-pay

## 2021-07-14 DIAGNOSIS — E538 Deficiency of other specified B group vitamins: Secondary | ICD-10-CM | POA: Diagnosis not present

## 2021-07-14 MED ORDER — CYANOCOBALAMIN 1000 MCG/ML IJ SOLN
1000.0000 ug | Freq: Once | INTRAMUSCULAR | Status: AC
  Administered 2021-07-14: 1000 ug via INTRAMUSCULAR

## 2021-07-14 NOTE — Telephone Encounter (Signed)
Patient would like a phone call to go over his results from his physical.

## 2021-07-14 NOTE — Progress Notes (Signed)
Per orders of Dr. Banks, injection of B12 given by [facility] My[facility]. ?Patient tolerated injection well. ? ?

## 2021-07-16 NOTE — Telephone Encounter (Signed)
Spoke with pt, had questions about labs, asked was it anything specific as someone called him and made B12 appt. He wanted to know what else was wrong, went over results and pt verbalized understanding.

## 2021-08-05 IMAGING — CT CT CHEST LUNG CANCER SCREENING LOW DOSE W/O CM
2 of 5 series · 15 of 40 positions shown, 18 images · non-contrast
Comparison: None.

CLINICAL DATA: 59-year-old asymptomatic male current smoker with 42
pack-year smoking history.

EXAM:
CT CHEST WITHOUT CONTRAST LOW-DOSE FOR LUNG CANCER SCREENING
TECHNIQUE: Multidetector CT imaging of the chest was performed following the
standard protocol without IV contrast.

[Series 4: lung 1.00 br44 cor · coronal · 0.61mm/px · 3 of 312 slices shown]
[im 63/312  lung]
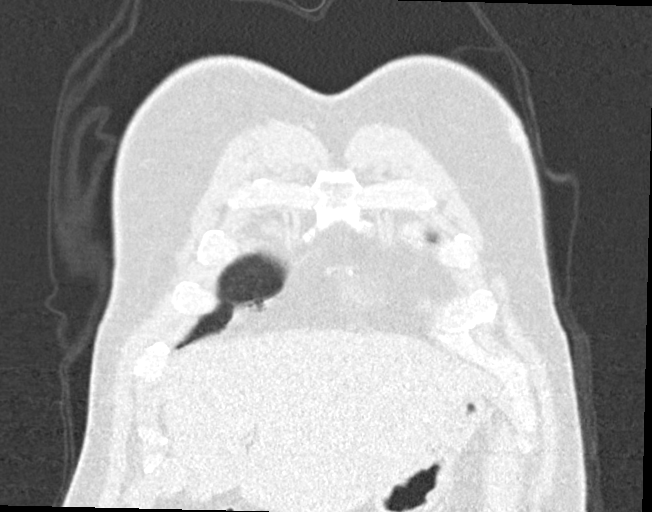
[im 125/312  lung]
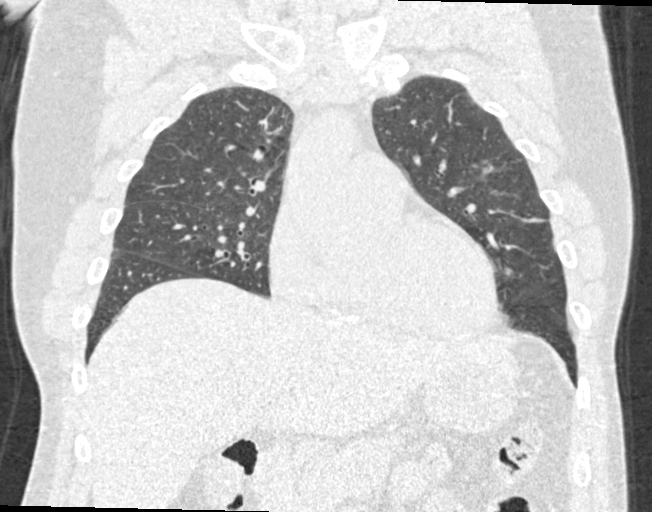
[im 187/312  lung]
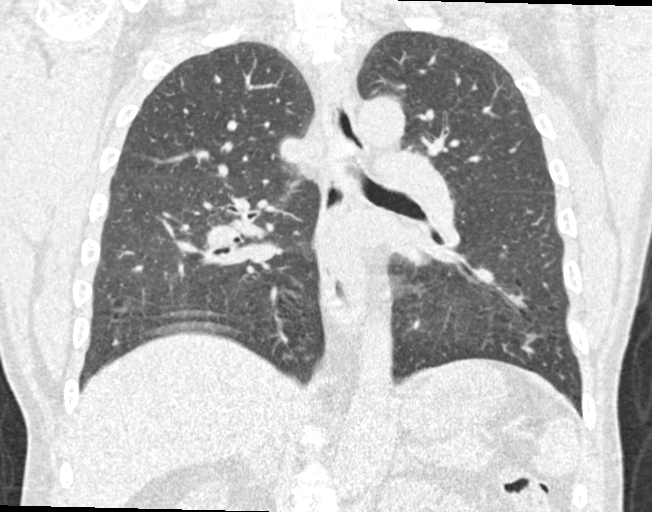

[Series 9: lung 1.00 br60 axial · axial · 0.72mm/px · z∈[-1093,-812]mm · 12 of 311 slices shown, 15 images]
[im 15/311  mediastinal]
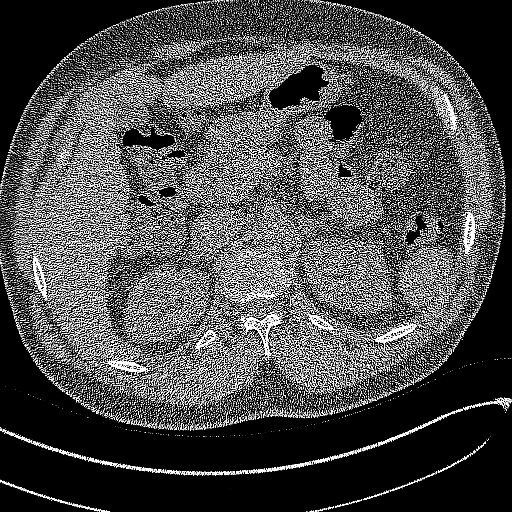
[im 15/311  lung]
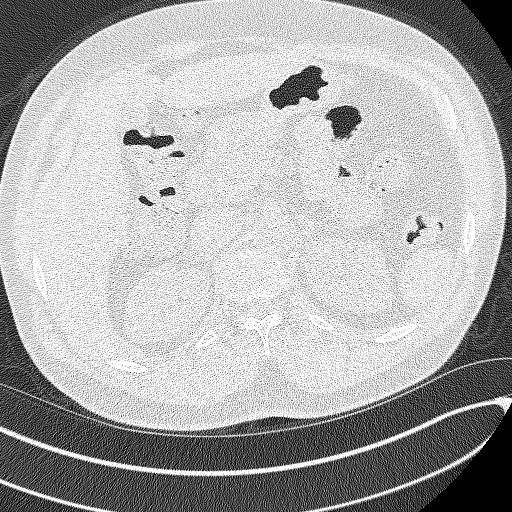
[im 43/311  lung]
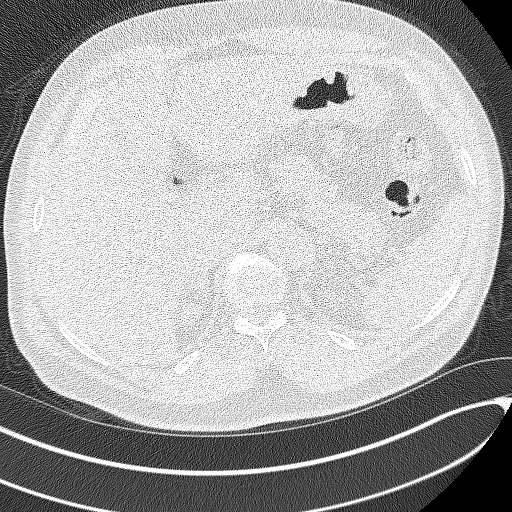
[im 71/311  lung]
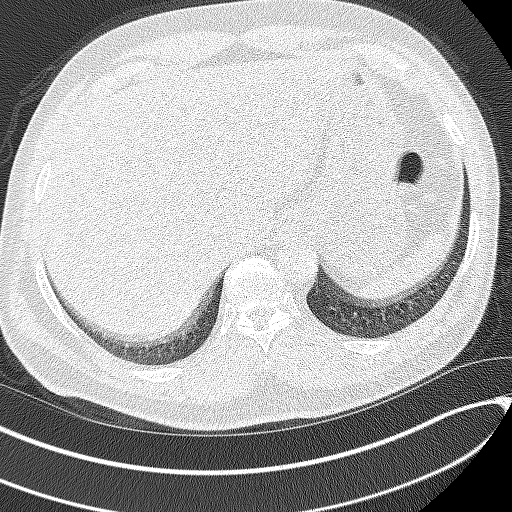
[im 99/311  lung]
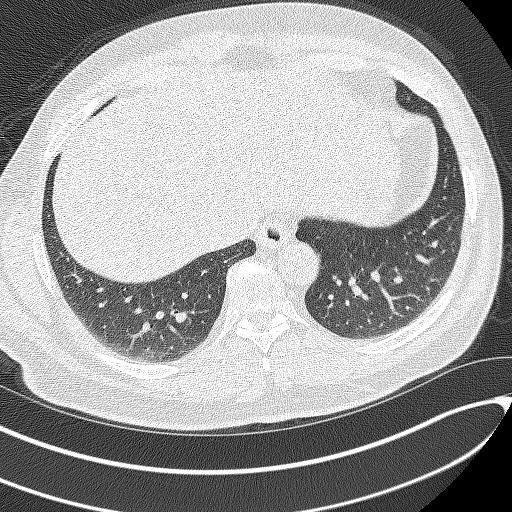
[im 113/311  mediastinal]
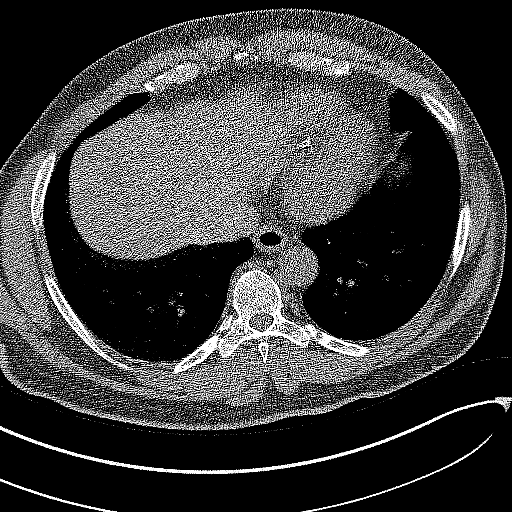
[im 113/311  lung]
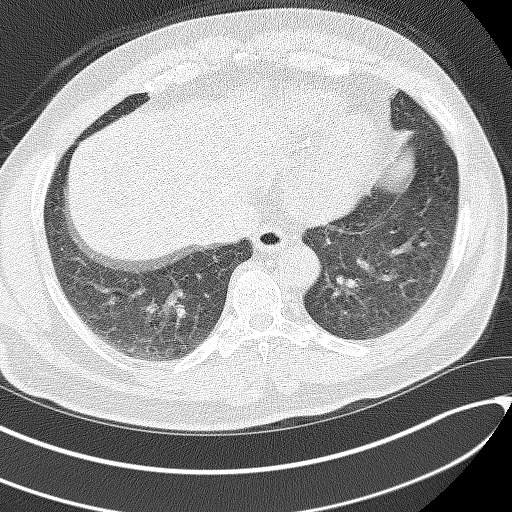
[im 141/311  lung]
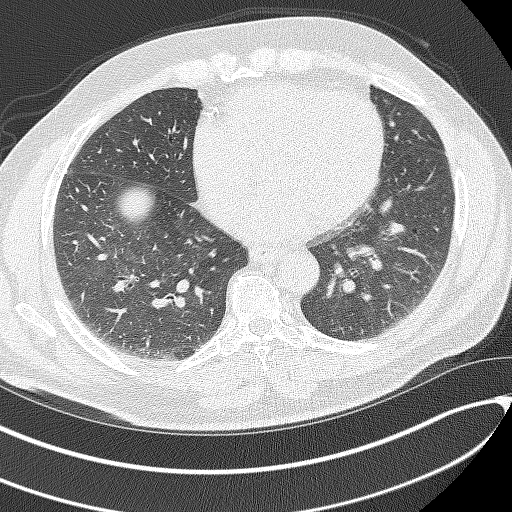
[im 170/311  lung]
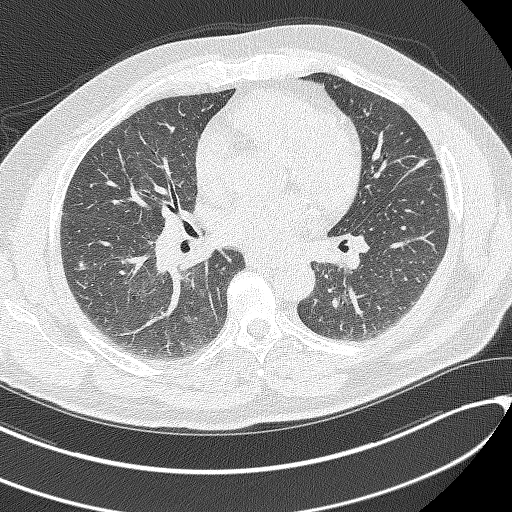
[im 198/311  lung]
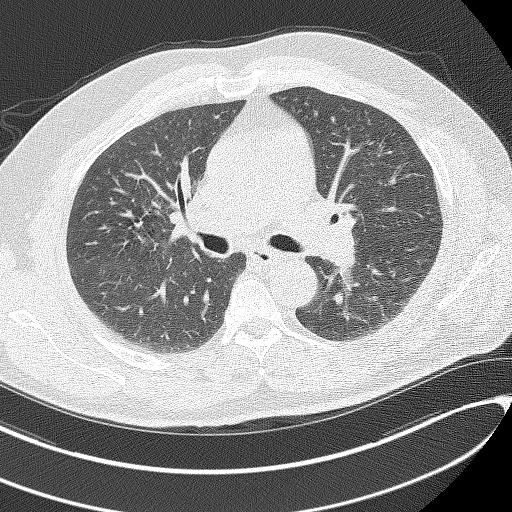
[im 212/311  mediastinal]
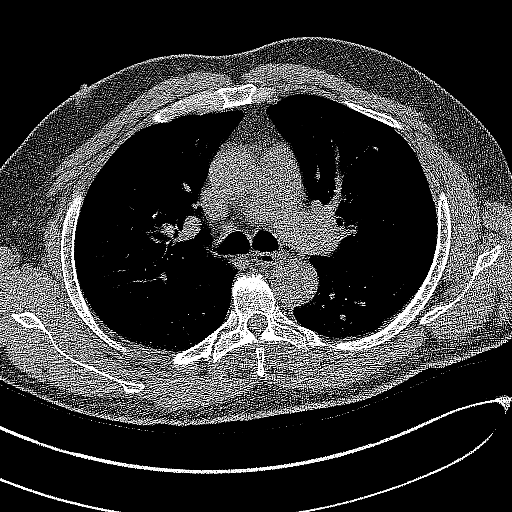
[im 212/311  lung]
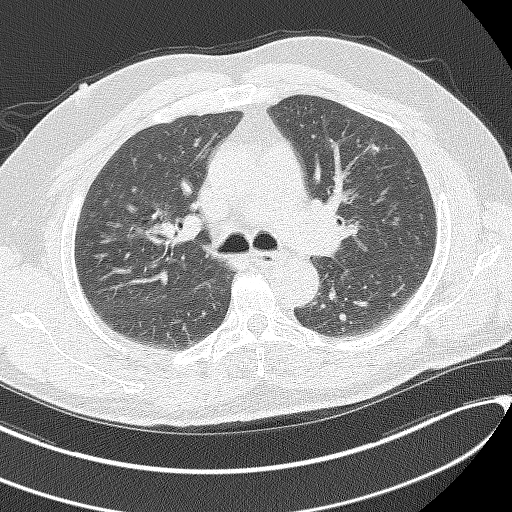
[im 240/311  lung]
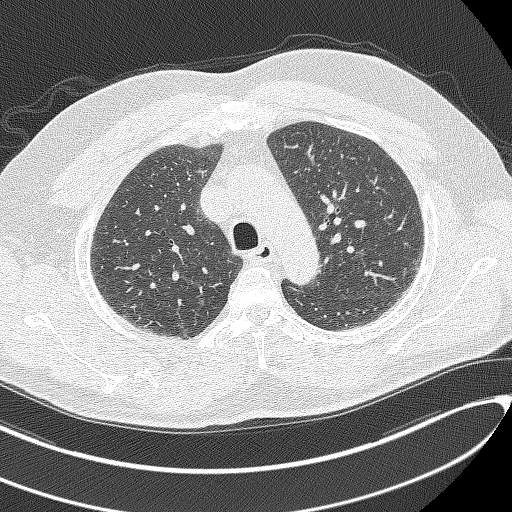
[im 268/311  lung]
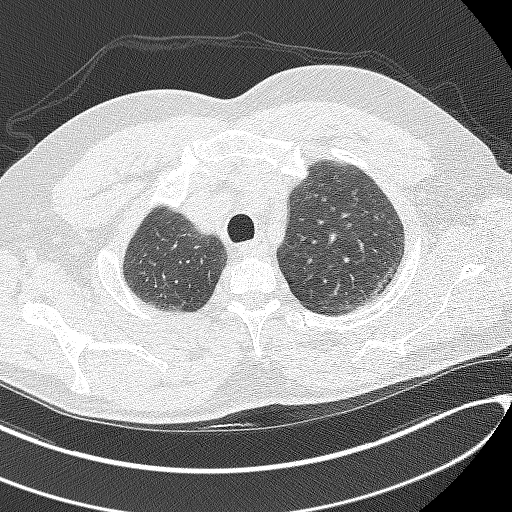
[im 296/311  lung]
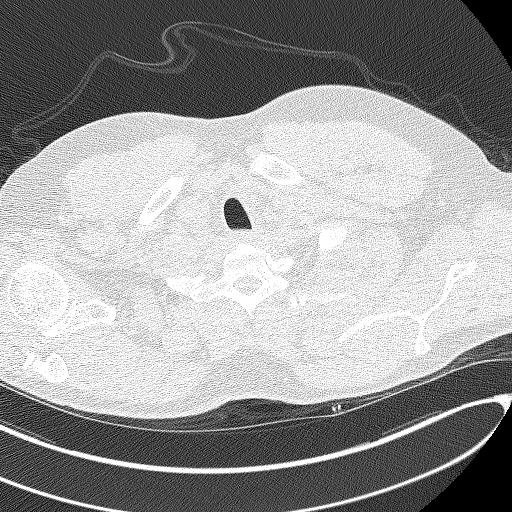

[15 of 40 positions shown; findings below may reference images not displayed]

FINDINGS: Motion degraded scan, limiting assessment.

Cardiovascular: Normal heart size. No significant pericardial
effusion/thickening. Three-vessel coronary atherosclerosis. Mildly
atherosclerotic nonaneurysmal thoracic aorta. Dilated main pulmonary
artery (3.8 cm diameter).

Mediastinum/Nodes: No discrete thyroid nodules. Unremarkable
esophagus. No pathologically enlarged axillary, mediastinal or hilar
lymph nodes, noting limited sensitivity for the detection of hilar
adenopathy on this noncontrast study.

Lungs/Pleura: No pneumothorax. No pleural effusion. Mild
centrilobular and paraseptal emphysema with diffuse bronchial wall
thickening. No acute consolidative airspace disease or lung masses.
Several scattered calcified granulomas in the upper lobes
bilaterally, right greater than left. Tiny peripheral right upper
lobe solid pulmonary nodule measuring 3.5 mm in volume derived mean
diameter (series 9/image 64). No additional significant pulmonary
nodules.

Upper abdomen: No acute abnormality.

Musculoskeletal: No aggressive appearing focal osseous lesions. Mild
thoracic spondylosis.
IMPRESSION: 1. Lung-RADS Category 2, benign appearance or behavior. Continue
annual screening with low-dose chest CT without contrast in 12
months.
2. Three-vessel coronary atherosclerosis.
3. Dilated main pulmonary artery, suggesting pulmonary arterial
hypertension.
4. Aortic Atherosclerosis (JTPSP-QBR.R) and Emphysema (JTPSP-WT2.N).

## 2021-09-01 ENCOUNTER — Other Ambulatory Visit: Payer: Self-pay | Admitting: Internal Medicine

## 2021-09-01 DIAGNOSIS — B20 Human immunodeficiency virus [HIV] disease: Secondary | ICD-10-CM

## 2021-09-25 ENCOUNTER — Other Ambulatory Visit: Payer: Self-pay | Admitting: Internal Medicine

## 2021-09-25 DIAGNOSIS — B20 Human immunodeficiency virus [HIV] disease: Secondary | ICD-10-CM

## 2021-09-27 MED ORDER — TIVICAY 50 MG PO TABS: 50.0000 mg | ORAL_TABLET | Freq: Every day | ORAL | 11 refills | Status: DC

## 2021-12-19 ENCOUNTER — Other Ambulatory Visit: Payer: Self-pay | Admitting: Internal Medicine

## 2021-12-19 DIAGNOSIS — B20 Human immunodeficiency virus [HIV] disease: Secondary | ICD-10-CM

## 2022-03-02 ENCOUNTER — Other Ambulatory Visit: Payer: Self-pay | Admitting: Family Medicine

## 2022-03-02 DIAGNOSIS — L308 Other specified dermatitis: Secondary | ICD-10-CM

## 2022-04-14 ENCOUNTER — Other Ambulatory Visit: Payer: Federal, State, Local not specified - PPO

## 2022-04-14 ENCOUNTER — Other Ambulatory Visit: Payer: Self-pay

## 2022-04-14 DIAGNOSIS — F259 Schizoaffective disorder, unspecified: Secondary | ICD-10-CM

## 2022-04-15 LAB — T-HELPER CELL (CD4) - (RCID CLINIC ONLY)
CD4 % Helper T Cell: 14 % — ABNORMAL LOW (ref 33–65)
CD4 T Cell Abs: 295 /uL — ABNORMAL LOW (ref 400–1790)

## 2022-04-17 LAB — CBC
HCT: 43.6 % (ref 38.5–50.0)
Hemoglobin: 13.9 g/dL (ref 13.2–17.1)
MCH: 26.5 pg — ABNORMAL LOW (ref 27.0–33.0)
MCHC: 31.9 g/dL — ABNORMAL LOW (ref 32.0–36.0)
MCV: 83.2 fL (ref 80.0–100.0)
MPV: 9.4 fL (ref 7.5–12.5)
Platelets: 300 10*3/uL (ref 140–400)
RBC: 5.24 10*6/uL (ref 4.20–5.80)
RDW: 17.4 % — ABNORMAL HIGH (ref 11.0–15.0)
WBC: 5.2 10*3/uL (ref 3.8–10.8)

## 2022-04-17 LAB — COMPREHENSIVE METABOLIC PANEL
AG Ratio: 1.3 (calc) (ref 1.0–2.5)
ALT: 9 U/L (ref 9–46)
AST: 14 U/L (ref 10–35)
Albumin: 4.2 g/dL (ref 3.6–5.1)
Alkaline phosphatase (APISO): 70 U/L (ref 35–144)
BUN: 11 mg/dL (ref 7–25)
CO2: 26 mmol/L (ref 20–32)
Calcium: 9.2 mg/dL (ref 8.6–10.3)
Chloride: 106 mmol/L (ref 98–110)
Creat: 1.16 mg/dL (ref 0.70–1.35)
Globulin: 3.2 g/dL (calc) (ref 1.9–3.7)
Glucose, Bld: 103 mg/dL — ABNORMAL HIGH (ref 65–99)
Potassium: 4.6 mmol/L (ref 3.5–5.3)
Sodium: 138 mmol/L (ref 135–146)
Total Bilirubin: 0.3 mg/dL (ref 0.2–1.2)
Total Protein: 7.4 g/dL (ref 6.1–8.1)

## 2022-04-17 LAB — HIV-1 RNA QUANT-NO REFLEX-BLD
HIV 1 RNA Quant: <20 Copies/mL — ABNORMAL HIGH
HIV-1 RNA Quant, Log: <1.3 Log cps/mL — ABNORMAL HIGH

## 2022-04-17 LAB — LIPID PANEL
Cholesterol: 204 mg/dL — ABNORMAL HIGH (ref <200)
HDL: 46 mg/dL (ref >=40)
LDL Cholesterol (Calc): 133 mg/dL (calc) — ABNORMAL HIGH
Non-HDL Cholesterol (Calc): 158 mg/dL (calc) — ABNORMAL HIGH (ref <130)
Total CHOL/HDL Ratio: 4.4 (calc) (ref <5.0)
Triglycerides: 133 mg/dL (ref <150)

## 2022-04-17 LAB — RPR: RPR Ser Ql: NONREACTIVE

## 2022-04-28 ENCOUNTER — Encounter: Payer: Self-pay | Admitting: Internal Medicine

## 2022-04-28 ENCOUNTER — Other Ambulatory Visit: Payer: Self-pay

## 2022-04-28 ENCOUNTER — Ambulatory Visit (INDEPENDENT_AMBULATORY_CARE_PROVIDER_SITE_OTHER): Payer: Federal, State, Local not specified - PPO | Admitting: Internal Medicine

## 2022-04-28 DIAGNOSIS — F17210 Nicotine dependence, cigarettes, uncomplicated: Secondary | ICD-10-CM | POA: Diagnosis not present

## 2022-04-28 DIAGNOSIS — Z21 Asymptomatic human immunodeficiency virus [HIV] infection status: Secondary | ICD-10-CM | POA: Diagnosis not present

## 2022-04-28 DIAGNOSIS — F259 Schizoaffective disorder, unspecified: Secondary | ICD-10-CM | POA: Diagnosis not present

## 2022-04-28 MED ORDER — DESCOVY 200-25 MG PO TABS: 1.0000 | ORAL_TABLET | Freq: Every day | ORAL | 3 refills | Status: DC

## 2022-04-28 MED ORDER — TIVICAY 50 MG PO TABS: 50.0000 mg | ORAL_TABLET | Freq: Every day | ORAL | 11 refills | Status: DC

## 2022-04-28 NOTE — Assessment & Plan Note (Signed)
His infection remains under excellent, long-term control.  He will continue Descovy and Tivicay.  He will follow-up after lab work in 1 year.

## 2022-04-28 NOTE — Assessment & Plan Note (Addendum)
He will continue his Zyprexa and I will arrange an initial evaluation by our behavioral health counselor.  I asked him to let [facility] and his PCP know that he is feeling more depressed.  We will arrange phone follow-up with me in 2 months.

## 2022-04-28 NOTE — Assessment & Plan Note (Signed)
I encouraged him to go through with his plan to cut down and asked him to set a quit date.

## 2022-04-28 NOTE — Progress Notes (Signed)
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/2021   ETOH abuse 05/03/2021   Eczema 05/03/2021   Mixed hyperlipidemia 09/19/2019   Obesity 05/01/2019   Chronic right shoulder pain 12/08/2017   Erectile dysfunction due to diseases classified elsewhere 12/08/2017   Routine general medical examination at a health care facility 06/23/2015   Dyslipidemia 01/08/2014   Cigarette nicotine dependence without complication 05/26/2011   Bipolar 1 disorder (HCC) 05/13/2011    Patient's Medications  New Prescriptions   No medications on file  Previous Medications   CIPROFLOXACIN (CIPRO) 500 MG TABLET    Take 1 tablet (500 mg total) by mouth 2 (two) times daily.   DICLOFENAC SODIUM (PENNSAID) 2 % SOLN    Place 1 application onto the skin 2 (two) times daily.   FOLIC ACID (V-R FOLIC ACID) 400 MCG TABLET    Take 1 tablet (400 mcg total) by mouth daily.   HYDROCORTISONE 2.5 % CREAM    Apply topically.   OLANZAPINE (ZYPREXA) 20 MG TABLET    Take 2 tablets (40 mg total) by mouth at bedtime.   TRIAMCINOLONE CREAM (KENALOG) 0.1 %    APPLY 1 APPLICATION TOPICALLY 2 (TWO) TIMES DAILY. NOT FOR FACE.  Modified Medications   Modified Medication Previous Medication   DOLUTEGRAVIR (TIVICAY) 50 MG TABLET dolutegravir (TIVICAY) 50 MG tablet      Take 1 tablet (50 mg total) by mouth daily.    Take 1 tablet (50 mg total) by mouth daily.   EMTRICITABINE-TENOFOVIR AF (DESCOVY) 200-25 MG TABLET DESCOVY 200-25 MG tablet      Take 1 tablet by mouth daily.    TAKE 1 TABLET BY MOUTH EVERY DAY  Discontinued Medications   No medications on file    Subjective: Randall Garrett is in for his routine HIV follow-up visit.  He denies any problems obtaining, taking or tolerating his Descovy or Tivicay and does not recall missing doses.  He  celebrated his 61th birthday earlier this year.  He says that he has been feeling a little more depressed over the last month.  He does not know of any new triggers.  He continues to take his Zyprexa.  He is followed at [facility] but says that they are currently unaware that he is feeling more depressed.  He does not have a Veterinary surgeon.  He continues to smoke cigarettes but did recently buy some nicotine patches to try to cut down.  Review of Systems: Review of Systems  Constitutional:  Negative for fever and weight loss.  Psychiatric/Behavioral:  Positive for depression.     Past Medical History:  Diagnosis Date   Bipolar 1 disorder (HCC) 05/13/2011   Cigarette smoker 05/26/2011   Have asked him to continue consideration of quitting smoking completely.    Depression    HIV (human immunodeficiency virus infection) (HCC) 05/13/2011   HIV infection (HCC)    Rash, skin 05/13/2011   Bilateral arm     Schizo affective schizophrenia (HCC) 05/13/2011    Social History   Tobacco Use   Smoking status: Every Day    Packs/day: 1.00    Years: 32.00    Total pack years: 32.00    Types: Cigarettes   Smokeless tobacco: Never  Vaping Use   Vaping Use: Never used  Substance Use Topics   Alcohol use: Yes    Alcohol/week: 1.0 standard drink of alcohol    Types: 1 Standard drinks or equivalent per week    Comment: once every 3 months   Drug use: No    Frequency: 7.0 times per week    Types: Marijuana    Family History  Problem Relation Age of Onset   Diabetes Mother    Hypertension Mother    Hyperlipidemia Mother    Stroke Mother    Diabetes Father    Hypertension Father    Hyperlipidemia Father    Diabetes Sister    Hypertension Sister    Hyperlipidemia Sister    Stroke Sister    Diabetes Brother    Hypertension Brother    Hyperlipidemia Brother    Cancer Maternal Grandmother     No Known Allergies  Health Maintenance  Topic Date Due   Zoster Vaccines- Shingrix (1 of 2) Never done    COLONOSCOPY (Pts 45-47yrs Insurance coverage will need to be confirmed)  Never done   COVID-19 Vaccine (3 - Pfizer risk series) 05/12/2021   INFLUENZA VACCINE  05/10/2022   TETANUS/TDAP  11/15/2022   Hepatitis C Screening  Completed   HIV Screening  Completed   HPV VACCINES  Aged Out    Objective:  Vitals:   04/28/22 0931  BP: 132/76  Pulse: 62  Resp: 16  SpO2: 95%  Weight: 244 lb 9.6 oz (110.9 kg)  Height: 6\' 1"  (1.854 m)   Body mass index is 32.27 kg/m.  Physical Exam Constitutional:      Comments: His spirits seem to be good.  Cardiovascular:     Rate and Rhythm: Normal rate and regular rhythm.  Pulmonary:     Effort: Pulmonary effort is normal.     Breath sounds: Normal breath sounds.  Psychiatric:        Mood and Affect: Mood normal.     Lab Results Lab Results  Component Value Date   WBC 5.2 04/14/2022   HGB 13.9 04/14/2022   HCT 43.6 04/14/2022   MCV 83.2 04/14/2022   PLT 300 04/14/2022    Lab Results  Component Value Date   CREATININE 1.16 04/14/2022   BUN 11 04/14/2022   NA 138 04/14/2022   K 4.6 04/14/2022   CL 106 04/14/2022   CO2 26 04/14/2022    Lab Results  Component Value Date   ALT 9 04/14/2022   AST 14 04/14/2022   ALKPHOS 72 05/03/2021   BILITOT 0.3 04/14/2022    Lab Results  Component Value Date   CHOL 204 (H) 04/14/2022   HDL 46 04/14/2022   LDLCALC 133 (H) 04/14/2022   TRIG 133 04/14/2022   CHOLHDL 4.4 04/14/2022   Lab Results  Component Value Date   LABRPR NON-REACTIVE 04/14/2022   HIV 1 RNA Quant  Date Value  04/14/2022 <20 Copies/mL (H)  03/31/2021 Not Detected Copies/mL  04/30/2020 <20 NOT DETECTED copies/mL   CD4 T Cell Abs (/uL)  Date Value  04/14/2022 295 (L)  03/31/2021 410  04/30/2020 430     Problem List Items Addressed This Visit       High   HIV (human immunodeficiency virus infection) (HCC)    His infection remains under excellent, long-term control.  He will continue Descovy and Tivicay.  He  will follow-up after lab work in 1 year.      Relevant Medications   emtricitabine-tenofovir AF (DESCOVY) 200-25 MG tablet   dolutegravir (TIVICAY)  50 MG tablet   Other Relevant Orders   CBC   T-helper cells (CD4) count (not at [facility])   Comprehensive metabolic panel   Lipid panel   RPR   HIV-1 RNA quant-no reflex-bld   Schizo affective schizophrenia [facility])    He will continue his Zyprexa and I will arrange an initial evaluation by our behavioral health counselor.  I asked him to let [facility] and his PCP know that he is feeling more depressed.  We will arrange phone follow-up with me in 2 months.        Unprioritized   Cigarette nicotine dependence without complication    I encouraged him to go through with his plan to cut down and asked him to set a quit date.         Cliffton Asters, MD [facility] [phone] pager   [phone] cell 04/28/2022, 9:52 AM

## 2022-05-17 ENCOUNTER — Other Ambulatory Visit: Payer: Self-pay

## 2022-05-17 ENCOUNTER — Ambulatory Visit: Payer: Federal, State, Local not specified - PPO

## 2022-05-17 NOTE — Progress Notes (Unsigned)
Clinician attempted to contact client, however, received no response or call back. Call went straight to voicemail

## 2022-06-09 ENCOUNTER — Other Ambulatory Visit: Payer: Self-pay | Admitting: Internal Medicine

## 2022-06-09 DIAGNOSIS — Z21 Asymptomatic human immunodeficiency virus [HIV] infection status: Secondary | ICD-10-CM

## 2022-06-29 ENCOUNTER — Encounter: Payer: Self-pay | Admitting: Internal Medicine

## 2022-06-29 ENCOUNTER — Other Ambulatory Visit: Payer: Self-pay

## 2022-06-29 ENCOUNTER — Ambulatory Visit (INDEPENDENT_AMBULATORY_CARE_PROVIDER_SITE_OTHER): Payer: Federal, State, Local not specified - PPO | Admitting: Internal Medicine

## 2022-06-29 DIAGNOSIS — F259 Schizoaffective disorder, unspecified: Secondary | ICD-10-CM

## 2022-06-29 NOTE — Progress Notes (Signed)
Virtual Visit via Video Note  I connected with Randall Garrett on 06/29/22 at 11:00 AM EDT by a video enabled telemedicine application and verified that I am speaking with the correct person using two identifiers.  Location: Patient: Home Provider: [facility]   I discussed the limitations of evaluation and management by telemedicine and the availability of in person appointments. The patient expressed understanding and agreed to proceed.  History of Present Illness: I called and spoke with Randall Garrett today.  He has not had any problems obtaining or taking his Descovy and Tivicay and does not recall missing any doses.  At the time of his visit 2 months ago he was feeling more depressed.  He has not been back to [place] since that time but says that he is feeling better.  He notes that when he is by himself for a long stretch of time he becomes more depressed and feels better when he is around other people.  He says that he currently has no desire to quit smoking cigarettes.  He does have some insight into the advantages of quitting.  He tells me that if he quit smoking he would have more money to travel more which she really enjoys.   Observations/Objective: HIV 1 RNA Quant  Date Value  04/14/2022 <20 Copies/mL (H)  03/31/2021 Not Detected Copies/mL  04/30/2020 <20 NOT DETECTED copies/mL   CD4 T Cell Abs (/uL)  Date Value  04/14/2022 295 (L)  03/31/2021 410  04/30/2020 430     Assessment and Plan: His infection remains under excellent, long-term control.  He will continue Descovy and Tivicay and follow-up in 6 months.  I encouraged him to let his counselors at [facility] know that his depression continues to wax and wane.  I encouraged him to reframe how he thinks about his cigarettes and focus on the advantages of quitting.  Follow Up Instructions: Follow-up here in 6 months   I discussed the assessment and treatment plan with the patient. The patient was provided an opportunity to ask  questions and all were answered. The patient agreed with the plan and demonstrated an understanding of the instructions.   The patient was advised to call back or seek an in-person evaluation if the symptoms worsen or if the condition fails to improve as anticipated.  I provided 16 minutes of non-face-to-face time during this encounter.   Michel Bickers, MD

## 2022-12-29 ENCOUNTER — Other Ambulatory Visit: Payer: Self-pay

## 2022-12-29 ENCOUNTER — Ambulatory Visit (INDEPENDENT_AMBULATORY_CARE_PROVIDER_SITE_OTHER): Payer: Federal, State, Local not specified - PPO | Admitting: Internal Medicine

## 2022-12-29 ENCOUNTER — Encounter: Payer: Self-pay | Admitting: Internal Medicine

## 2022-12-29 DIAGNOSIS — Z21 Asymptomatic human immunodeficiency virus [HIV] infection status: Secondary | ICD-10-CM | POA: Diagnosis not present

## 2022-12-29 MED ORDER — TIVICAY 50 MG PO TABS: 50.0000 mg | ORAL_TABLET | Freq: Every day | ORAL | 3 refills | Status: DC

## 2022-12-29 MED ORDER — DESCOVY 200-25 MG PO TABS: 1.0000 | ORAL_TABLET | Freq: Every day | ORAL | 3 refills | Status: DC

## 2022-12-29 NOTE — Assessment & Plan Note (Signed)
His infection has been under excellent, long-term control.  I will update his lab work today.  He could simplify his regimen to [facility] but prefers to stay on his current 2 pill regimen.  He will follow-up in 1 year.

## 2022-12-29 NOTE — Progress Notes (Signed)
Patient Active Problem List   Diagnosis Date Noted   HIV (human immunodeficiency virus infection) ([place]) 05/13/2011    Priority: High   Schizo affective schizophrenia ([place]) 05/13/2011    Priority: High   Folate deficiency 05/04/2021   ETOH abuse 05/03/2021   Eczema 05/03/2021   Mixed hyperlipidemia 09/19/2019   Obesity 05/01/2019   Chronic right shoulder pain 12/08/2017   Erectile dysfunction due to diseases classified elsewhere 12/08/2017   Routine general medical examination at a health care facility 06/23/2015   Dyslipidemia 01/08/2014   Cigarette nicotine dependence without complication [id number]   Bipolar 1 disorder ([place]) 05/13/2011    Patient's Medications  New Prescriptions   No medications on file  Previous Medications   CIPROFLOXACIN (CIPRO) 500 MG TABLET    Take 1 tablet (500 mg total) by mouth 2 (two) times daily.   DICLOFENAC SODIUM (PENNSAID) 2 % SOLN    Place 1 application onto the skin 2 (two) times daily.   FOLIC ACID (V-R FOLIC ACID) A999333 MCG TABLET    Take 1 tablet (400 mcg total) by mouth daily.   HYDROCORTISONE 2.5 % CREAM    Apply topically.   OLANZAPINE (ZYPREXA) 20 MG TABLET    Take 2 tablets (40 mg total) by mouth at bedtime.   TRIAMCINOLONE CREAM (KENALOG) 0.1 %    APPLY 1 APPLICATION TOPICALLY 2 (TWO) TIMES DAILY. NOT FOR FACE.  Modified Medications   Modified Medication Previous Medication   DOLUTEGRAVIR (TIVICAY) 50 MG TABLET dolutegravir (TIVICAY) 50 MG tablet      Take 1 tablet (50 mg total) by mouth daily.    Take 1 tablet (50 mg total) by mouth daily.   EMTRICITABINE-TENOFOVIR AF (DESCOVY) 200-25 MG TABLET emtricitabine-tenofovir AF (DESCOVY) 200-25 MG tablet      Take 1 tablet by mouth daily.    Take 1 tablet by mouth daily.  Discontinued Medications   No medications on file    Subjective: Jaycob is in for his routine HIV follow-up visit.  He has not had any problems obtaining, taking or tolerating his Descovy or Tivicay and  never misses doses.  He gets his medication free with co-pay assistance.  He is down to smoking about 10 cigarettes daily and hopes to be able to quit in the coming months.  He is not feeling as depressed as he was last year.  He says that his mood is down recently.  He continues to go to [place] to get his Zyprexa.  He is looking forward to several trips in the coming months with friends.  Review of Systems: Review of Systems  Constitutional:  Negative for fever and weight loss.  Psychiatric/Behavioral:  Positive for depression. Negative for suicidal ideas. The patient is not nervous/anxious.     Past Medical History:  Diagnosis Date   Bipolar 1 disorder ([place]) 05/13/2011   Cigarette smoker 05/26/2011   Have asked him to continue consideration of quitting smoking completely.    Depression    HIV (human immunodeficiency virus infection) ([place]) 05/13/2011   HIV infection ([place])    Rash, skin 05/13/2011   Bilateral arm     Schizo affective schizophrenia ([place]) 05/13/2011    Social History   Tobacco Use   Smoking status: Every Day    Packs/day: 1.00    Years: 32.00    Additional pack years: 0.00    Total pack years: 32.00    Types: Cigarettes   Smokeless tobacco: Never  Vaping Use   Vaping Use: Never used  Substance Use Topics   Alcohol use: Yes    Alcohol/week: 1.0 standard drink of alcohol    Types: 1 Standard drinks or equivalent per week    Comment: once every 3 months   Drug use: No    Frequency: 7.0 times per week    Types: Marijuana    Family History  Problem Relation Age of Onset   Diabetes Mother    Hypertension Mother    Hyperlipidemia Mother    Stroke Mother    Diabetes Father    Hypertension Father    Hyperlipidemia Father    Diabetes Sister    Hypertension Sister    Hyperlipidemia Sister    Stroke Sister    Diabetes Brother    Hypertension Brother    Hyperlipidemia Brother    Cancer Maternal Grandmother     No Known Allergies  Health Maintenance  Topic  Date Due   Zoster Vaccines- Shingrix (1 of 2) Never done   COLONOSCOPY (Pts 45-46yrs Insurance coverage will need to be confirmed)  Never done   Lung Cancer Screening  01/09/2021   COVID-19 Vaccine (3 - Pfizer risk series) 05/12/2021   INFLUENZA VACCINE  05/10/2022   DTaP/Tdap/Td (2 - Td or Tdap) 11/15/2022   Hepatitis C Screening  Completed   HIV Screening  Completed   HPV VACCINES  Aged Out    Objective:  Vitals:   12/29/22 1051  BP: 120/76  Pulse: 62  Temp: 97.6 F (36.4 C)  TempSrc: Oral  SpO2: 98%  Weight: 237 lb 3.2 oz (107.6 kg)   Body mass index is 31.29 kg/m.  Physical Exam Constitutional:      Comments: He is talkative and in good spirits as usual.  Cardiovascular:     Rate and Rhythm: Normal rate and regular rhythm.     Heart sounds: No murmur heard. Pulmonary:     Effort: Pulmonary effort is normal.     Breath sounds: Normal breath sounds.     Lab Results Lab Results  Component Value Date   WBC 5.2 04/14/2022   HGB 13.9 04/14/2022   HCT 43.6 04/14/2022   MCV 83.2 04/14/2022   PLT 300 04/14/2022    Lab Results  Component Value Date   CREATININE 1.16 04/14/2022   BUN 11 04/14/2022   NA 138 04/14/2022   K 4.6 04/14/2022   CL 106 04/14/2022   CO2 26 04/14/2022    Lab Results  Component Value Date   ALT 9 04/14/2022   AST 14 04/14/2022   ALKPHOS 72 05/03/2021   BILITOT 0.3 04/14/2022    Lab Results  Component Value Date   CHOL 204 (H) 04/14/2022   HDL 46 04/14/2022   LDLCALC 133 (H) 04/14/2022   TRIG 133 04/14/2022   CHOLHDL 4.4 04/14/2022   Lab Results  Component Value Date   LABRPR NON-REACTIVE 04/14/2022   HIV 1 RNA Quant  Date Value  04/14/2022 <20 Copies/mL (H)  03/31/2021 Not Detected Copies/mL  04/30/2020 <20 NOT DETECTED copies/mL   CD4 T Cell Abs (/uL)  Date Value  04/14/2022 295 (L)  03/31/2021 410  04/30/2020 430     Problem List Items Addressed This Visit       High   HIV (human immunodeficiency virus  infection) ([place])    His infection has been under excellent, long-term control.  I will update his lab work today.  He could simplify his regimen to [place] but prefers  to stay on his current 2 pill regimen.  He will follow-up in 1 year.      Relevant Medications   dolutegravir (TIVICAY) 50 MG tablet   emtricitabine-tenofovir AF (DESCOVY) 200-25 MG tablet   Other Relevant Orders   T-helper cells (CD4) count (not at [facility])   HIV-1 RNA quant-no reflex-bld   CBC   Comprehensive metabolic panel   RPR   Lipid panel      Michel Bickers, MD [facility] [place] [address] [phone] pager   [phone] cell 12/29/2022, 11:17 AM

## 2022-12-30 LAB — T-HELPER CELLS (CD4) COUNT (NOT AT ARMC)
CD4 % Helper T Cell: 16 % — ABNORMAL LOW (ref 33–65)
CD4 T Cell Abs: 465 /uL (ref 400–1790)

## 2022-12-31 LAB — COMPREHENSIVE METABOLIC PANEL
AG Ratio: 1.4 (calc) (ref 1.0–2.5)
ALT: 9 U/L (ref 9–46)
AST: 12 U/L (ref 10–35)
Albumin: 4.4 g/dL (ref 3.6–5.1)
Alkaline phosphatase (APISO): 80 U/L (ref 35–144)
BUN/Creatinine Ratio: 6 (calc) (ref 6–22)
BUN: 6 mg/dL — ABNORMAL LOW (ref 7–25)
CO2: 28 mmol/L (ref 20–32)
Calcium: 9.6 mg/dL (ref 8.6–10.3)
Chloride: 104 mmol/L (ref 98–110)
Creat: 1.04 mg/dL (ref 0.70–1.35)
Globulin: 3.1 g/dL (calc) (ref 1.9–3.7)
Glucose, Bld: 93 mg/dL (ref 65–99)
Potassium: 4.5 mmol/L (ref 3.5–5.3)
Sodium: 138 mmol/L (ref 135–146)
Total Bilirubin: 0.4 mg/dL (ref 0.2–1.2)
Total Protein: 7.5 g/dL (ref 6.1–8.1)

## 2022-12-31 LAB — HIV-1 RNA QUANT-NO REFLEX-BLD
HIV 1 RNA Quant: <20 Copies/mL — ABNORMAL HIGH
HIV-1 RNA Quant, Log: <1.3 Log cps/mL — ABNORMAL HIGH

## 2022-12-31 LAB — LIPID PANEL
Cholesterol: 186 mg/dL (ref <200)
HDL: 46 mg/dL (ref >=40)
LDL Cholesterol (Calc): 122 mg/dL (calc) — ABNORMAL HIGH
Non-HDL Cholesterol (Calc): 140 mg/dL (calc) — ABNORMAL HIGH (ref <130)
Total CHOL/HDL Ratio: 4 (calc) (ref <5.0)
Triglycerides: 84 mg/dL (ref <150)

## 2022-12-31 LAB — CBC
HCT: 46.6 % (ref 38.5–50.0)
Hemoglobin: 16.1 g/dL (ref 13.2–17.1)
MCH: 29.7 pg (ref 27.0–33.0)
MCHC: 34.5 g/dL (ref 32.0–36.0)
MCV: 86 fL (ref 80.0–100.0)
MPV: 9.9 fL (ref 7.5–12.5)
Platelets: 280 10*3/uL (ref 140–400)
RBC: 5.42 10*6/uL (ref 4.20–5.80)
RDW: 16.7 % — ABNORMAL HIGH (ref 11.0–15.0)
WBC: 6.1 10*3/uL (ref 3.8–10.8)

## 2022-12-31 LAB — RPR: RPR Ser Ql: NONREACTIVE

## 2023-07-07 ENCOUNTER — Ambulatory Visit: Payer: Federal, State, Local not specified - PPO | Admitting: Family Medicine

## 2023-07-28 ENCOUNTER — Encounter: Payer: Self-pay | Admitting: Family Medicine

## 2023-07-28 ENCOUNTER — Ambulatory Visit: Payer: Federal, State, Local not specified - PPO | Admitting: Family Medicine

## 2023-07-28 VITALS — BP 116/70 | HR 64 | Temp 98.3°F | Ht 73.0 in | Wt 236.2 lb

## 2023-07-28 DIAGNOSIS — F17210 Nicotine dependence, cigarettes, uncomplicated: Secondary | ICD-10-CM

## 2023-07-28 DIAGNOSIS — H6123 Impacted cerumen, bilateral: Secondary | ICD-10-CM | POA: Diagnosis not present

## 2023-07-28 DIAGNOSIS — E782 Mixed hyperlipidemia: Secondary | ICD-10-CM

## 2023-07-28 DIAGNOSIS — Z23 Encounter for immunization: Secondary | ICD-10-CM | POA: Diagnosis not present

## 2023-07-28 DIAGNOSIS — Z125 Encounter for screening for malignant neoplasm of prostate: Secondary | ICD-10-CM

## 2023-07-28 DIAGNOSIS — Z1211 Encounter for screening for malignant neoplasm of colon: Secondary | ICD-10-CM

## 2023-07-28 DIAGNOSIS — Z0000 Encounter for general adult medical examination without abnormal findings: Secondary | ICD-10-CM | POA: Diagnosis not present

## 2023-07-28 DIAGNOSIS — L308 Other specified dermatitis: Secondary | ICD-10-CM | POA: Diagnosis not present

## 2023-07-28 DIAGNOSIS — Z21 Asymptomatic human immunodeficiency virus [HIV] infection status: Secondary | ICD-10-CM

## 2023-07-28 LAB — LIPID PANEL
Cholesterol: 220 mg/dL — ABNORMAL HIGH (ref 0–200)
HDL: 44.4 mg/dL (ref >39.00)
LDL Cholesterol: 159 mg/dL — ABNORMAL HIGH (ref 0–99)
NonHDL: 175.48
Total CHOL/HDL Ratio: 5
Triglycerides: 80 mg/dL (ref 0.0–149.0)
VLDL: 16 mg/dL (ref 0.0–40.0)

## 2023-07-28 LAB — COMPREHENSIVE METABOLIC PANEL
ALT: 7 U/L (ref 0–53)
AST: 11 U/L (ref 0–37)
Albumin: 4.2 g/dL (ref 3.5–5.2)
Alkaline Phosphatase: 85 U/L (ref 39–117)
BUN: 12 mg/dL (ref 6–23)
CO2: 27 meq/L (ref 19–32)
Calcium: 9.6 mg/dL (ref 8.4–10.5)
Chloride: 102 meq/L (ref 96–112)
Creatinine, Ser: 1 mg/dL (ref 0.40–1.50)
GFR: 81.2 mL/min (ref >60.00)
Glucose, Bld: 89 mg/dL (ref 70–99)
Potassium: 4.5 meq/L (ref 3.5–5.1)
Sodium: 136 meq/L (ref 135–145)
Total Bilirubin: 0.3 mg/dL (ref 0.2–1.2)
Total Protein: 7.4 g/dL (ref 6.0–8.3)

## 2023-07-28 LAB — CBC WITH DIFFERENTIAL/PLATELET
Basophils Absolute: 0.1 10*3/uL (ref 0.0–0.1)
Basophils Relative: 0.8 % (ref 0.0–3.0)
Eosinophils Absolute: 0.1 10*3/uL (ref 0.0–0.7)
Eosinophils Relative: 0.9 % (ref 0.0–5.0)
HCT: 47.9 % (ref 39.0–52.0)
Hemoglobin: 16.1 g/dL (ref 13.0–17.0)
Lymphocytes Relative: 43.4 % (ref 12.0–46.0)
Lymphs Abs: 3.5 10*3/uL (ref 0.7–4.0)
MCHC: 33.6 g/dL (ref 30.0–36.0)
MCV: 90.4 fL (ref 78.0–100.0)
Monocytes Absolute: 0.6 10*3/uL (ref 0.1–1.0)
Monocytes Relative: 6.9 % (ref 3.0–12.0)
Neutro Abs: 3.9 10*3/uL (ref 1.4–7.7)
Neutrophils Relative %: 48 % (ref 43.0–77.0)
Platelets: 286 10*3/uL (ref 150.0–400.0)
RBC: 5.3 Mil/uL (ref 4.22–5.81)
RDW: 15 % (ref 11.5–15.5)
WBC: 8.2 10*3/uL (ref 4.0–10.5)

## 2023-07-28 LAB — PSA: PSA: 1.16 ng/mL (ref 0.10–4.00)

## 2023-07-28 LAB — TSH: TSH: 2.28 u[IU]/mL (ref 0.35–5.50)

## 2023-07-28 MED ORDER — TRIAMCINOLONE ACETONIDE 0.1 % EX CREA
1.0000 | TOPICAL_CREAM | Freq: Two times a day (BID) | CUTANEOUS | 1 refills | Status: AC
  Filled 2023-10-12: qty 30, 30d supply, fill #0
  Filled 2023-10-12: qty 30, 15d supply, fill #0

## 2023-07-28 MED ORDER — HYDROCORTISONE 2.5 % EX CREA: TOPICAL_CREAM | Freq: Two times a day (BID) | CUTANEOUS | 1 refills | Status: AC

## 2023-07-28 NOTE — Progress Notes (Unsigned)
Established Patient Office Visit   Subjective  Patient ID: Randall Garrett, male    DOB: 1961/01/20  Age: 62 y.o. MRN: [id number]  Chief Complaint  Patient presents with  . Annual Exam    Would like to discuss sore on scalp and interested in lung screening for smoke. Requesting written Rx.     HPI  {History (Optional):23778}  ROS Negative unless stated above    Objective:     BP 116/70 (BP Location: Right Arm, Patient Position: Sitting, Cuff Size: Large)   Pulse 64   Temp 98.3 F (36.8 C) (Oral)   Ht 6\' 1"  (1.854 m)   Wt 236 lb 3.2 oz (107.1 kg)   SpO2 96%   BMI 31.16 kg/m  {Vitals History (Optional):23777}  Physical Exam   No results found for any visits on 07/28/23.    Assessment & Plan:  Routine general medical examination at a health care facility -     CBC with Differential/Platelet -     TSH  Other eczema -     Hydrocortisone; Apply topically 2 (two) times daily.  Dispense: 30 g; Refill: 1 -     Triamcinolone Acetonide; Apply 1 Application topically 2 (two) times daily. Not for face.  Dispense: 30 g; Refill: 1  Influenza vaccine needed -     Flu vaccine trivalent PF, 6mos and older(Flulaval,Afluria,Fluarix,Fluzone)  Need for shingles vaccine  Need for tetanus booster -     Tdap vaccine greater than or equal to 7yo IM  Cigarette nicotine dependence without complication -     CT CHEST LUNG CANCER SCREENING LOW DOSE WO CONTRAST; Future  Prostate cancer screening -     PSA  Bilateral impacted cerumen  Mixed hyperlipidemia -     Comprehensive metabolic panel -     Lipid panel  Colon cancer screening -     Cologuard    Return in about 4 months (around 11/28/2023).   Deeann Saint, MD

## 2023-08-07 ENCOUNTER — Other Ambulatory Visit (HOSPITAL_COMMUNITY): Payer: Self-pay

## 2023-08-07 ENCOUNTER — Other Ambulatory Visit: Payer: Self-pay

## 2023-08-07 ENCOUNTER — Other Ambulatory Visit: Payer: Self-pay | Admitting: Family Medicine

## 2023-08-07 DIAGNOSIS — L989 Disorder of the skin and subcutaneous tissue, unspecified: Secondary | ICD-10-CM

## 2023-08-07 MED ORDER — VARENICLINE TARTRATE 0.5 MG PO TABS
ORAL_TABLET | ORAL | 0 refills | Status: AC
  Filled 2023-08-07: qty 56, 30d supply, fill #0

## 2023-08-07 MED ORDER — TIVICAY 50 MG PO TABS
50.0000 mg | ORAL_TABLET | Freq: Every day | ORAL | 1 refills | Status: DC
Start: 2023-08-07 — End: 2023-08-09
  Filled 2023-08-07: qty 90, 90d supply, fill #0

## 2023-08-07 MED ORDER — DESCOVY 200-25 MG PO TABS
1.0000 | ORAL_TABLET | Freq: Every day | ORAL | 1 refills | Status: DC
Start: 2023-08-07 — End: 2023-08-09
  Filled 2023-08-07 (×2): qty 90, 90d supply, fill #0

## 2023-08-08 ENCOUNTER — Other Ambulatory Visit: Payer: Self-pay

## 2023-08-08 ENCOUNTER — Other Ambulatory Visit (HOSPITAL_COMMUNITY): Payer: Self-pay

## 2023-08-09 ENCOUNTER — Other Ambulatory Visit: Payer: Self-pay | Admitting: Pharmacist

## 2023-08-09 ENCOUNTER — Other Ambulatory Visit: Payer: Self-pay

## 2023-08-09 ENCOUNTER — Other Ambulatory Visit (HOSPITAL_COMMUNITY): Payer: Self-pay

## 2023-08-09 DIAGNOSIS — Z21 Asymptomatic human immunodeficiency virus [HIV] infection status: Secondary | ICD-10-CM

## 2023-08-09 MED ORDER — DESCOVY 200-25 MG PO TABS
1.0000 | ORAL_TABLET | Freq: Every day | ORAL | 1 refills | Status: DC
  Filled 2023-08-10: qty 90, 90d supply, fill #0
  Filled 2023-12-28: qty 90, 90d supply, fill #1

## 2023-08-09 MED ORDER — TIVICAY 50 MG PO TABS
50.0000 mg | ORAL_TABLET | Freq: Every day | ORAL | 1 refills | Status: DC
  Filled 2023-08-10: qty 90, 90d supply, fill #0
  Filled 2023-11-08: qty 90, 90d supply, fill #1

## 2023-08-10 ENCOUNTER — Other Ambulatory Visit: Payer: Self-pay

## 2023-08-10 ENCOUNTER — Other Ambulatory Visit (HOSPITAL_COMMUNITY): Payer: Self-pay

## 2023-08-10 ENCOUNTER — Other Ambulatory Visit: Payer: Self-pay | Admitting: Pharmacist

## 2023-08-10 MED ORDER — OLANZAPINE 20 MG PO TABS
40.0000 mg | ORAL_TABLET | Freq: Every day | ORAL | 0 refills | Status: AC
  Filled 2023-08-10 – 2023-08-14 (×4): qty 180, 90d supply, fill #0

## 2023-08-10 NOTE — Progress Notes (Addendum)
Specialty Pharmacy Initial Fill Coordination Note  Randall Garrett is a 62 y.o. male contacted today regarding refills of specialty medication(s) Dolutegravir Sodium   Patient requested Courier to Provider Office   Delivery date: 08/14/23   Verified address: [address]  [address] [place] [address]   Medication will be filled on 08/11/23.   Patient is aware of $0 copayment.

## 2023-08-10 NOTE — Progress Notes (Signed)
Specialty Pharmacy Initiation Note   Randall Garrett is a 62 y.o. male who will be followed by the specialty pharmacy service for RxSp HIV    Review of administration, indication, effectiveness, safety, potential side effects, storage/disposable, and missed dose instructions occurred today for patient's specialty medication(s) Dolutegravir Sodium; Emtricitabine-Tenofovir Af     Patient/Caregiver did not have any additional questions or concerns.   Patient's therapy is appropriate to: Initiate    Goals Addressed             This Visit's Progress    Achieve Undetectable HIV Viral Load < 20       Patient is on track. Patient will maintain adherence.      Comply with lab assessments       Patient is on track. Patient will adhere to provider and/or lab appointments.      Improve or maintain quality of life       Patient is on track. Patient will be monitored by provider to determine if a change in treatment plan is warranted.         Adalea Handler L. Shantaya Bluestone, PharmD, BCIDP, AAHIVP, CPP Clinical Pharmacist Practitioner Infectious Diseases Clinical Pharmacist [facility] 08/10/2023, 1:56 PM

## 2023-08-11 ENCOUNTER — Other Ambulatory Visit: Payer: Self-pay

## 2023-08-11 ENCOUNTER — Other Ambulatory Visit (HOSPITAL_COMMUNITY): Payer: Self-pay

## 2023-08-11 NOTE — Progress Notes (Signed)
Specialty Pharmacy Initial Fill Coordination Note  Randall Garrett is a 62 y.o. male contacted today regarding refills of specialty medication(s) Emtricitabine-Tenofovir Af   Patient requested Delivery   Delivery date: 09/12/23   Verified address: [address] [place] [address]   Medication will be filled on 09/11/23.   Patient is aware of $0 copayment.

## 2023-08-11 NOTE — Progress Notes (Signed)
error 

## 2023-08-14 ENCOUNTER — Other Ambulatory Visit: Payer: Self-pay

## 2023-08-14 ENCOUNTER — Other Ambulatory Visit (HOSPITAL_COMMUNITY): Payer: Self-pay

## 2023-08-14 ENCOUNTER — Telehealth: Payer: Self-pay

## 2023-08-14 NOTE — Telephone Encounter (Signed)
[facility] Patient Advocate Encounter  Patient's medications TIVICAY have been couriered to [facility] from [employer] [employer] and will be picked up from [facility] .  Kae Heller, CPhT Specialty Pharmacy Patient [facility] Phone: [phone] Fax:  [phone]

## 2023-08-15 ENCOUNTER — Other Ambulatory Visit (HOSPITAL_COMMUNITY): Payer: Self-pay

## 2023-08-17 ENCOUNTER — Ambulatory Visit
Admission: RE | Admit: 2023-08-17 | Discharge: 2023-08-17 | Disposition: A | Discharge status: Home / Self Care | Payer: Federal, State, Local not specified - PPO | Source: Ambulatory Visit | Attending: Family Medicine | Admitting: Family Medicine

## 2023-08-17 DIAGNOSIS — F17210 Nicotine dependence, cigarettes, uncomplicated: Secondary | ICD-10-CM

## 2023-08-25 LAB — COLOGUARD: COLOGUARD: NEGATIVE

## 2023-08-29 ENCOUNTER — Other Ambulatory Visit: Payer: Self-pay

## 2023-09-11 ENCOUNTER — Encounter: Payer: Self-pay | Admitting: Dermatology

## 2023-09-11 ENCOUNTER — Ambulatory Visit: Payer: Federal, State, Local not specified - PPO | Admitting: Dermatology

## 2023-09-11 VITALS — BP 139/81 | HR 64 | Temp 97.4°F

## 2023-09-11 DIAGNOSIS — L739 Follicular disorder, unspecified: Secondary | ICD-10-CM

## 2023-09-11 MED ORDER — CLOBETASOL PROPIONATE 0.05 % EX SOLN
1.0000 | Freq: Two times a day (BID) | CUTANEOUS | 2 refills | Status: AC
  Filled 2023-10-12 (×2): qty 50, 25d supply, fill #0

## 2023-09-11 MED ORDER — CLINDAMYCIN PHOSPHATE 1 % EX LOTN
TOPICAL_LOTION | Freq: Every day | CUTANEOUS | 2 refills | Status: AC
  Filled 2023-10-12 (×2): qty 60, 30d supply, fill #0

## 2023-09-11 NOTE — Patient Instructions (Addendum)
Apply clobetasol directly to scalp where it itches Apply clindamycin to scabs daily until cleared up.   Important Information  Due to recent changes in healthcare laws, you may see results of your pathology and/or laboratory studies on MyChart before the doctors have had a chance to review them. We understand that in some cases there may be results that are confusing or concerning to you. Please understand that not all results are received at the same time and often the doctors may need to interpret multiple results in order to provide you with the best plan of care or course of treatment. Therefore, we ask that you please give [place] 2 business days to thoroughly review all your results before contacting the office for clarification. Should we see a critical lab result, you will be contacted sooner.   If You Need Anything After Your Visit  If you have any questions or concerns for your doctor, please call our main line at [phone] If no one answers, please leave a voicemail as directed and we will return your call as soon as possible. Messages left after 4 pm will be answered the following business day.   You may also send [place] a message via MyChart. We typically respond to MyChart messages within 1-2 business days.  For prescription refills, please ask your pharmacy to contact our office. Our fax number is [phone].  If you have an urgent issue when the clinic is closed that cannot wait until the next business day, you can page your doctor at the number below.    Please note that while we do our best to be available for urgent issues outside of office hours, we are not available 24/7.   If you have an urgent issue and are unable to reach [place], you may choose to seek medical care at your doctor's office, retail clinic, urgent care center, or emergency room.  If you have a medical emergency, please immediately call 911 or go to the emergency department. In the event of inclement weather,  please call our main line at [phone] for an update on the status of any delays or closures.  Dermatology Medication Tips: Please keep the boxes that topical medications come in in order to help keep track of the instructions about where and how to use these. Pharmacies typically print the medication instructions only on the boxes and not directly on the medication tubes.   If your medication is too expensive, please contact our office at [phone] or send [place] a message through MyChart.   We are unable to tell what your co-pay for medications will be in advance as this is different depending on your insurance coverage. However, we may be able to find a substitute medication at lower cost or fill out paperwork to get insurance to cover a needed medication.   If a prior authorization is required to get your medication covered by your insurance company, please allow [place] 1-2 business days to complete this process.  Drug prices often vary depending on where the prescription is filled and some pharmacies may offer cheaper prices.  The website www.goodrx.com contains coupons for medications through different pharmacies. The prices here do not account for what the cost may be with help from insurance (it may be cheaper with your insurance), but the website can give you the price if you did not use any insurance.  - You can print the associated coupon and take it with your prescription to the pharmacy.  - You may also  stop by our office during regular business hours and pick up a GoodRx coupon card.  - If you need your prescription sent electronically to a different pharmacy, notify our office through [facility] or by phone at [phone]    Skin Education :   I counseled the patient regarding the following: Sun screen (SPF 30 or greater) should be applied during peak UV exposure (between 10am and 2pm) and reapplied after exercise or swimming.  The ABCDEs of melanoma were reviewed with  the patient, and the importance of monthly self-examination of moles was emphasized. Should any moles change in shape or color, or itch, bleed or burn, pt will contact our office for evaluation sooner then their interval appointment.  Plan: Sunscreen Recommendations I recommended a broad spectrum sunscreen with a SPF of 30 or higher. I explained that SPF 30 sunscreens block approximately 97 percent of the sun's harmful rays. Sunscreens should be applied at least 15 minutes prior to expected sun exposure and then every 2 hours after that as long as sun exposure continues. If swimming or exercising sunscreen should be reapplied every 45 minutes to an hour after getting wet or sweating. One ounce, or the equivalent of a shot glass full of sunscreen, is adequate to protect the skin not covered by a bathing suit. I also recommended a lip balm with a sunscreen as well. Sun protective clothing can be used in lieu of sunscreen but must be worn the entire time you are exposed to the sun's rays.

## 2023-09-11 NOTE — Progress Notes (Signed)
   New Patient Visit   Subjective  Randall Garrett is a 62 y.o. male who presents for the following: spots on scalp. NO hx or family hx of skin cancer. The patient has spots, moles and lesions to be evaluated, some may be new or changing.  The following portions of the chart were reviewed this encounter and updated as appropriate: medications, allergies, medical history  Review of Systems:  No other skin or systemic complaints except as noted in HPI or Assessment and Plan.  Objective  Well appearing patient in no apparent distress; mood and affect are within normal limits.  A focused examination was performed of the following areas:  Scalp  Relevant exam findings are noted in the Assessment and Plan. Left Frontal Scalp, Mid Frontal Scalp, Right Frontal Scalp folliculitis    Assessment & Plan   FOLLICULITIS Exam: Perifollicular erythematous papules and pustules  Folliculitis occurs due to inflammation of the superficial hair follicle (pore), resulting in acne-like lesions (pus bumps). It can be infectious (bacterial, fungal) or noninfectious (shaving, tight clothing, heat/sweat, medications).  Folliculitis can be acute or chronic and recommended treatment depends on the underlying cause of folliculitis.  Treatment Plan: Clobetasol solution for itching Clindamycin for hemorrhagic crusting  Folliculitis Left Frontal Scalp; Right Frontal Scalp; Mid Frontal Scalp  Related Medications clobetasol (TEMOVATE) 0.05 % external solution Apply 1 Application topically 2 (two) times daily.  clindamycin (CLEOCIN-T) 1 % lotion Apply topically daily. Apply to scabs daily as needed    Return if symptoms worsen or fail to improve.   Documentation: I have reviewed the above documentation for accuracy and completeness, and I agree with the above.  Gwenith Daily, MD

## 2023-09-12 ENCOUNTER — Other Ambulatory Visit (HOSPITAL_COMMUNITY): Payer: Self-pay

## 2023-09-12 ENCOUNTER — Other Ambulatory Visit: Payer: Self-pay

## 2023-09-19 ENCOUNTER — Encounter: Payer: Self-pay | Admitting: Family Medicine

## 2023-09-19 DIAGNOSIS — I2721 Secondary pulmonary arterial hypertension: Secondary | ICD-10-CM | POA: Insufficient documentation

## 2023-09-19 DIAGNOSIS — J432 Centrilobular emphysema: Secondary | ICD-10-CM | POA: Insufficient documentation

## 2023-09-22 ENCOUNTER — Other Ambulatory Visit: Payer: Self-pay

## 2023-09-22 DIAGNOSIS — I2721 Secondary pulmonary arterial hypertension: Secondary | ICD-10-CM

## 2023-10-12 ENCOUNTER — Other Ambulatory Visit: Payer: Self-pay

## 2023-10-12 ENCOUNTER — Other Ambulatory Visit (HOSPITAL_COMMUNITY): Payer: Self-pay

## 2023-10-12 MED ORDER — HYDROCORTISONE 2.5 % EX CREA
1.0000 | TOPICAL_CREAM | Freq: Two times a day (BID) | CUTANEOUS | 1 refills | Status: AC
  Filled 2023-10-12 (×2): qty 30, 30d supply, fill #0

## 2023-10-24 ENCOUNTER — Other Ambulatory Visit (HOSPITAL_COMMUNITY): Payer: Self-pay

## 2023-11-03 ENCOUNTER — Other Ambulatory Visit: Payer: Self-pay

## 2023-11-06 ENCOUNTER — Other Ambulatory Visit: Payer: Self-pay

## 2023-11-08 ENCOUNTER — Other Ambulatory Visit: Payer: Self-pay

## 2023-11-08 NOTE — Progress Notes (Signed)
Specialty Pharmacy Refill Coordination Note  Randall Garrett is a 63 y.o. male contacted today regarding refills of specialty medication(s) Dolutegravir Sodium (Tivicay)   Patient requested Delivery   Delivery date: 11/09/23   Verified address: [address]  [place] [address]   Medication will be filled on 11/08/23.

## 2023-11-16 ENCOUNTER — Other Ambulatory Visit: Payer: Self-pay

## 2023-11-16 MED ORDER — OLANZAPINE 20 MG PO TABS
40.0000 mg | ORAL_TABLET | Freq: Every day | ORAL | 0 refills | Status: DC
  Filled 2023-11-16: qty 60, 30d supply, fill #0

## 2023-11-17 ENCOUNTER — Other Ambulatory Visit (HOSPITAL_COMMUNITY): Payer: Self-pay

## 2023-11-17 ENCOUNTER — Other Ambulatory Visit: Payer: Self-pay

## 2023-11-23 ENCOUNTER — Other Ambulatory Visit: Payer: Self-pay

## 2023-11-27 ENCOUNTER — Ambulatory Visit: Payer: Federal, State, Local not specified - PPO | Admitting: Family Medicine

## 2023-11-27 ENCOUNTER — Other Ambulatory Visit (HOSPITAL_COMMUNITY): Payer: Self-pay

## 2023-11-27 ENCOUNTER — Encounter: Payer: Self-pay | Admitting: Family Medicine

## 2023-11-27 ENCOUNTER — Other Ambulatory Visit: Payer: Self-pay

## 2023-11-27 VITALS — BP 120/68 | HR 80 | Temp 97.8°F | Ht 73.0 in | Wt 237.6 lb

## 2023-11-27 DIAGNOSIS — D763 Other histiocytosis syndromes: Secondary | ICD-10-CM | POA: Diagnosis not present

## 2023-11-27 DIAGNOSIS — F17210 Nicotine dependence, cigarettes, uncomplicated: Secondary | ICD-10-CM | POA: Diagnosis not present

## 2023-11-27 DIAGNOSIS — E782 Mixed hyperlipidemia: Secondary | ICD-10-CM

## 2023-11-27 DIAGNOSIS — J432 Centrilobular emphysema: Secondary | ICD-10-CM

## 2023-11-27 DIAGNOSIS — Z21 Asymptomatic human immunodeficiency virus [HIV] infection status: Secondary | ICD-10-CM

## 2023-11-27 DIAGNOSIS — I700 Atherosclerosis of aorta: Secondary | ICD-10-CM

## 2023-11-27 MED ORDER — NICOTINE 21 MG/24HR TD PT24
21.0000 mg | MEDICATED_PATCH | Freq: Every day | TRANSDERMAL | 1 refills | Status: AC
  Filled 2023-11-27: qty 28, 28d supply, fill #0

## 2023-11-27 MED ORDER — ROSUVASTATIN CALCIUM 10 MG PO TABS
10.0000 mg | ORAL_TABLET | Freq: Every day | ORAL | 3 refills | Status: AC
  Filled 2023-11-27 – 2023-12-07 (×2): qty 90, 90d supply, fill #0
  Filled 2024-03-15: qty 90, 90d supply, fill #1
  Filled 2024-06-12: qty 90, 90d supply, fill #2
  Filled 2024-09-24: qty 90, 90d supply, fill #3

## 2023-11-27 MED ORDER — NICOTINE 14 MG/24HR TD PT24
14.0000 mg | MEDICATED_PATCH | Freq: Every day | TRANSDERMAL | 0 refills | Status: AC
  Filled 2023-11-27: qty 28, 28d supply, fill #0

## 2023-11-27 NOTE — Progress Notes (Signed)
 Established Patient Office Visit   Subjective  Patient ID: Randall Garrett, male    DOB: 10-01-1961  Age: 63 y.o. MRN: [id number]  Chief Complaint  Patient presents with   Medical Management of Chronic Issues    Pt is a 63 yo male seen for f/u.  Pt had questions regarding emphysema on recent CT lung cancer screen.  Pt states he knows he needs to quit smoking.  Willing to try patches.  Denies SOB, CP.  Endorses smoker's cough.  Noticed several bumps on upper eyelids.  Non pruritic, non erythematous.  No changes in soaps, lotions, or detergents.    Patient Active Problem List   Diagnosis Date Noted   Centrilobular and paraseptal emphysema (HCC) 09/19/2023   Pulmonary artery hypertension (HCC) 09/19/2023   Folate deficiency 05/04/2021   ETOH abuse 05/03/2021   Eczema 05/03/2021   Mixed hyperlipidemia 09/19/2019   Obesity 05/01/2019   Chronic right shoulder pain 12/08/2017   Erectile dysfunction due to diseases classified elsewhere 12/08/2017   Routine general medical examination at a health care facility 06/23/2015   Dyslipidemia 01/08/2014   Cigarette nicotine dependence without complication 05/26/2011   Bipolar 1 disorder ([facility]) 05/13/2011   HIV (human immunodeficiency virus infection) ([facility]) 05/13/2011   Schizo affective schizophrenia ([facility]) 05/13/2011   Past Medical History:  Diagnosis Date   Bipolar 1 disorder (HCC) 05/13/2011   Cigarette smoker 05/26/2011   Have asked him to continue consideration of quitting smoking completely.    Depression    HIV (human immunodeficiency virus infection) ([facility]) 05/13/2011   HIV infection ([facility])    Rash, skin 05/13/2011   Bilateral arm     Schizo affective schizophrenia ([facility]) 05/13/2011   Past Surgical History:  Procedure Laterality Date   Broke Left arm  1991   Social History   Tobacco Use   Smoking status: Every Day    Current packs/day: 1.00    Average packs/day: 1 pack/day for 32.0 years (32.0 ttl pk-yrs)    Types: Cigarettes    Smokeless tobacco: Never  Vaping Use   Vaping status: Never Used  Substance Use Topics   Alcohol use: Yes    Alcohol/week: 1.0 standard drink of alcohol    Types: 1 Standard drinks or equivalent per week    Comment: once every 3 months   Drug use: No    Frequency: 7.0 times per week    Types: Marijuana   Family History  Problem Relation Age of Onset   Diabetes Mother    Hypertension Mother    Hyperlipidemia Mother    Stroke Mother    Diabetes Father    Hypertension Father    Hyperlipidemia Father    Diabetes Sister    Hypertension Sister    Hyperlipidemia Sister    Stroke Sister    Diabetes Brother    Hypertension Brother    Hyperlipidemia Brother    Cancer Maternal Grandmother    No Known Allergies    ROS Negative unless stated above    Objective:     BP 120/68   Pulse 80   Temp 97.8 F (36.6 C) (Oral)   Ht 6\' 1"  (1.854 m)   Wt 237 lb 9.6 oz (107.8 kg)   SpO2 96%   BMI 31.35 kg/m  BP Readings from Last 3 Encounters:  11/27/23 120/68  09/11/23 139/81  07/28/23 116/70   Wt Readings from Last 3 Encounters:  11/27/23 237 lb 9.6 oz (107.8 kg)  07/28/23 236 lb 3.2 oz (107.1  kg)  12/29/22 237 lb 3.2 oz (107.6 kg)    Physical Exam Constitutional:      General: He is not in acute distress.    Appearance: Normal appearance.  HENT:     Head: Normocephalic and atraumatic.     Nose: Nose normal.     Mouth/Throat:     Mouth: Mucous membranes are moist.  Cardiovascular:     Rate and Rhythm: Normal rate and regular rhythm.     Heart sounds: Normal heart sounds. No murmur heard.    No gallop.  Pulmonary:     Effort: Pulmonary effort is normal. No respiratory distress.     Breath sounds: Normal breath sounds. No wheezing, rhonchi or rales.  Skin:    General: Skin is warm and dry.     Comments: Waxy appearing papules of b/l upper eyelids without erythema, edema, induration.  Neurological:     Mental Status: He is alert and oriented to person, place,  and time.      No results found for any visits on 11/27/23.    Assessment & Plan:  Centrilobular and paraseptal emphysema (HCC)  Cigarette nicotine dependence without complication -     Nicotine; Place 1 patch (21 mg total) onto the skin daily. Then taper down to 14 mg patches.  Dispense: 28 patch; Refill: 1 -     Nicotine; Place 1 patch (14 mg total) onto the skin daily. Use after 21 mg patches.  Dispense: 28 patch; Refill: 0  Subcutaneous xanthogranulomatosis (HCC)  Aortic atherosclerosis (HCC) -     Rosuvastatin Calcium; Take 1 tablet (10 mg total) by mouth daily for cholesterol.  Dispense: 90 tablet; Refill: 3  Mixed hyperlipidemia -     Rosuvastatin Calcium; Take 1 tablet (10 mg total) by mouth daily for cholesterol.  Dispense: 90 tablet; Refill: 3  Asymptomatic HIV infection, with no history of HIV-related illness (HCC)   Patient currently smoking a little less than a pack a day.  CT chest lung cancer screening on 08/17/2023 with enlargement of pulmonary outflow track/main pulmonary arteries suggesting pulmonary arterial hypertension.  Emphysema and aortic atherosclerosis also noted.  Discussed the importance of smoking cessation.  Patient willing to try NicoDerm patches.  Rx sent to pharmacy.  Repeat low-dose CT scan in 1 year.  Consider ex inhaler.  Patient wishes to wait at this time. 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  Consider referral to dermatology versus Optho if removal desired.  Rosuvastatin 10 mg sent to pharmacy.  Continue lifestyle modifications.  Continue Tivicay and Descovy along with follow-up with ID.  Return in about 3 months (around 02/24/2024) for chronic conditions.   Deeann Saint, MD

## 2023-11-29 ENCOUNTER — Other Ambulatory Visit: Payer: Self-pay

## 2023-11-30 ENCOUNTER — Other Ambulatory Visit (HOSPITAL_COMMUNITY): Payer: Self-pay

## 2023-12-01 ENCOUNTER — Other Ambulatory Visit (HOSPITAL_COMMUNITY): Payer: Self-pay

## 2023-12-07 ENCOUNTER — Other Ambulatory Visit (HOSPITAL_COMMUNITY): Payer: Self-pay

## 2023-12-07 ENCOUNTER — Other Ambulatory Visit (HOSPITAL_BASED_OUTPATIENT_CLINIC_OR_DEPARTMENT_OTHER): Payer: Self-pay

## 2023-12-07 ENCOUNTER — Other Ambulatory Visit: Payer: Self-pay

## 2023-12-07 MED ORDER — OLANZAPINE 20 MG PO TABS
40.0000 mg | ORAL_TABLET | Freq: Every day | ORAL | 0 refills | Status: DC
  Filled 2023-12-07 – 2023-12-11 (×2): qty 180, 90d supply, fill #0

## 2023-12-11 ENCOUNTER — Other Ambulatory Visit (HOSPITAL_COMMUNITY): Payer: Self-pay

## 2023-12-11 ENCOUNTER — Other Ambulatory Visit: Payer: Self-pay

## 2023-12-13 ENCOUNTER — Other Ambulatory Visit (HOSPITAL_COMMUNITY): Payer: Self-pay

## 2023-12-28 ENCOUNTER — Other Ambulatory Visit: Payer: Self-pay

## 2023-12-28 ENCOUNTER — Ambulatory Visit: Payer: Federal, State, Local not specified - PPO | Admitting: Internal Medicine

## 2023-12-28 NOTE — Progress Notes (Signed)
 Specialty Pharmacy Refill Coordination Note  Randall Garrett is a 63 y.o. male contacted today regarding refills of specialty medication(s) Emtricitabine-Tenofovir AF (Descovy)   Patient requested Delivery   Delivery date: 12/29/23   Verified address: [address]  [place] [address]   Medication will be filled on 12/28/23.

## 2024-01-24 ENCOUNTER — Other Ambulatory Visit: Payer: Self-pay

## 2024-01-29 ENCOUNTER — Other Ambulatory Visit: Payer: Self-pay

## 2024-01-31 ENCOUNTER — Other Ambulatory Visit (HOSPITAL_COMMUNITY): Payer: Self-pay

## 2024-01-31 ENCOUNTER — Other Ambulatory Visit: Payer: Self-pay

## 2024-01-31 ENCOUNTER — Telehealth: Payer: Self-pay

## 2024-01-31 ENCOUNTER — Other Ambulatory Visit: Payer: Self-pay | Admitting: Pharmacist

## 2024-01-31 DIAGNOSIS — Z21 Asymptomatic human immunodeficiency virus [HIV] infection status: Secondary | ICD-10-CM

## 2024-01-31 MED ORDER — TIVICAY 50 MG PO TABS
50.0000 mg | ORAL_TABLET | Freq: Every day | ORAL | 0 refills | Status: DC
  Filled 2024-01-31: qty 30, 30d supply, fill #0

## 2024-01-31 MED ORDER — DESCOVY 200-25 MG PO TABS
1.0000 | ORAL_TABLET | Freq: Every day | ORAL | 0 refills | Status: DC
  Filled 2024-01-31: qty 30, 30d supply, fill #0

## 2024-01-31 NOTE — Progress Notes (Signed)
 Specialty Pharmacy Refill Coordination Note  Randall Garrett is a 63 y.o. male contacted today regarding refills of specialty medication(s) Dolutegravir  Sodium (Tivicay )   Patient requested Delivery   Delivery date: 02/08/24   Verified address: [address]  [place] [address]   Medication will be filled on 04.29.25.   This fill date is pending response to refill request from provider. Patient is aware and if they have not received fill by intended date they must follow up with pharmacy.

## 2024-01-31 NOTE — Telephone Encounter (Signed)
 30 day supply sent to pharmacy. Additional refills will be provided at appt.  Julien Odor, RMA

## 2024-01-31 NOTE — Telephone Encounter (Signed)
 Patient called to reschedule missed appt with Dr. Shereen Dike. Previously Dr. Daina Drum patient.   Asked to see another provider due to Dr. Everlean Hoar availability currently, so was able to schedule a little sooner with Dr. Gillian Lacrosse on 02/15/2024 and patient will be coming in on 02/05/2024 for Lab.   He did express concerns of his refills. He mentioned the Tivicay  and Descovy . He also confirmed that he does still [employer]	[place].  Is it possible to coordinate refills for him?

## 2024-01-31 NOTE — Addendum Note (Signed)
 Addended by: Amberia Bayless M on: 01/31/2024 02:13 PM   Modules accepted: Orders

## 2024-02-05 ENCOUNTER — Other Ambulatory Visit

## 2024-02-06 ENCOUNTER — Other Ambulatory Visit: Payer: Self-pay

## 2024-02-15 ENCOUNTER — Ambulatory Visit (INDEPENDENT_AMBULATORY_CARE_PROVIDER_SITE_OTHER): Admitting: Infectious Diseases

## 2024-02-15 ENCOUNTER — Other Ambulatory Visit: Payer: Self-pay

## 2024-02-15 ENCOUNTER — Other Ambulatory Visit (HOSPITAL_COMMUNITY): Payer: Self-pay

## 2024-02-15 ENCOUNTER — Encounter: Payer: Self-pay | Admitting: Infectious Diseases

## 2024-02-15 VITALS — BP 121/75 | HR 73 | Temp 98.3°F | Wt 231.0 lb

## 2024-02-15 DIAGNOSIS — Z21 Asymptomatic human immunodeficiency virus [HIV] infection status: Secondary | ICD-10-CM

## 2024-02-15 DIAGNOSIS — Z7185 Encounter for immunization safety counseling: Secondary | ICD-10-CM

## 2024-02-15 DIAGNOSIS — B20 Human immunodeficiency virus [HIV] disease: Secondary | ICD-10-CM

## 2024-02-15 DIAGNOSIS — Z0000 Encounter for general adult medical examination without abnormal findings: Secondary | ICD-10-CM

## 2024-02-15 DIAGNOSIS — F17210 Nicotine dependence, cigarettes, uncomplicated: Secondary | ICD-10-CM | POA: Diagnosis not present

## 2024-02-15 DIAGNOSIS — Z79899 Other long term (current) drug therapy: Secondary | ICD-10-CM

## 2024-02-15 DIAGNOSIS — Z113 Encounter for screening for infections with a predominantly sexual mode of transmission: Secondary | ICD-10-CM

## 2024-02-15 DIAGNOSIS — F17200 Nicotine dependence, unspecified, uncomplicated: Secondary | ICD-10-CM

## 2024-02-15 MED ORDER — TIVICAY 50 MG PO TABS
50.0000 mg | ORAL_TABLET | Freq: Every day | ORAL | 11 refills | Status: DC
  Filled 2024-02-15 – 2024-04-01 (×2): qty 30, 30d supply, fill #0
  Filled 2024-04-25: qty 30, 30d supply, fill #1
  Filled 2024-05-24 – 2024-05-31 (×2): qty 30, 30d supply, fill #2
  Filled 2024-06-26 – 2024-07-18 (×2): qty 30, 30d supply, fill #3
  Filled 2024-08-09 – 2024-08-12 (×2): qty 30, 30d supply, fill #4

## 2024-02-15 MED ORDER — DESCOVY 200-25 MG PO TABS
1.0000 | ORAL_TABLET | Freq: Every day | ORAL | 11 refills | Status: DC
  Filled 2024-02-15 – 2024-04-01 (×2): qty 30, 30d supply, fill #0
  Filled 2024-04-25: qty 30, 30d supply, fill #1
  Filled 2024-05-24 – 2024-05-31 (×2): qty 30, 30d supply, fill #2
  Filled 2024-06-26 – 2024-07-18 (×2): qty 30, 30d supply, fill #3
  Filled 2024-08-09 – 2024-08-12 (×2): qty 30, 30d supply, fill #4

## 2024-02-15 NOTE — Patient Instructions (Signed)
 Smoking Cessation: QuitlineNC 1-800-QUIT-NOW 707-701-6721); Espaol: 1-855-Djelo-Ya (1-780-445-4976) [email or web address]

## 2024-02-15 NOTE — Progress Notes (Addendum)
 [address], [place], [place], [address]                                                                  Phn. [phone]; Fax: [phone]                                                                             Date: 02/15/24  Reason for Visit: Routine HIV care.  HPI: Randall Garrett is a 63 y.o.old male with a history of Bipolar d/o, Depression, Schizophrenia, Hyperlipidemia, Emphysema, smoker HIV ( Previously on Truvada and Isentress , switched to Descovy  and Tivicay  in 10/27/2015  for simplification) who is here for fu. Patient previously followed by Dr Seymour Dapper.   Reports compliance with Descovy  and Tivicay  without any missed doses or concerns.  He reports not being sexually active for twenty years and prefers male partners only.  He smokes ten cigarettes daily, reduced from a previous pack a day, and is attempting to quit. He lives alone and has recently lost six pounds, now weighing 231 pounds. He follows with his PCP. Wants to be seen by dentist, Refused vaccines and STD screen today. No complaints otherwise.  ROS: As stated in above HPI; all other systems were reviewed and are otherwise negative unless noted below  No reported fever / chills, night sweats, unintentional weight loss, acute visual change, odynophagia, chest pain/pressure, new or worsened SOB or WOB, nausea, vomiting, diarrhea, dysuria, GU discharge, syncope, seizures, red/hot swollen joints, hallucinations / delusions, rashes, new allergies, unusual / excessive bleeding, swollen lymph nodes, or new hospitalizations/ED visits/Urgent Care visits since the pt was last seen.  PMH/ PSH/ FamHx / Social Hx , medications and allergies reviewed and updated as appropriate; please see corresponding tab in EHR / prior notes                                         Current Outpatient Medications on File Prior to Visit  Medication Sig Dispense Refill   clindamycin  (CLEOCIN -T) 1 % lotion Apply topically daily. Apply to scabs daily as needed 60 mL 2   clobetasol  (TEMOVATE ) 0.05 % external solution Apply 1 Application topically 2 (two) times daily. 50 mL 2   folic acid  (V-R FOLIC ACID ) 400 MCG tablet Take 1 tablet (400 mcg total) by mouth daily. 90 tablet 3   hydrocortisone  2.5 % cream Apply topically 2 (two) times daily. 30 g 1   hydrocortisone  2.5 % cream Apply 1 Application topically 2 (two) times daily. 30 g 1   OLANZapine  (  ZYPREXA ) 20 MG tablet Take 2 tablets (40 mg total) by mouth at bedtime. 180 tablet 0   rosuvastatin  (CRESTOR ) 10 MG tablet Take 1 tablet (10 mg total) by mouth daily for cholesterol. 90 tablet 3   triamcinolone  cream (KENALOG ) 0.1 % Apply 1 Application topically 2 (two) times daily. Not for face. 30 g 1   nicotine  (NICODERM CQ ) 14 mg/24hr patch Place 1 patch (14 mg total) onto the skin daily. Use after 21 mg patches. (Patient not taking: Reported on 02/15/2024) 28 patch 0   nicotine  (NICODERM CQ ) 21 mg/24hr patch Place 1 patch (21 mg total) onto the skin daily. Then taper down to 14 mg patches. (Patient not taking: Reported on 02/15/2024) 28 patch 1   OLANZapine  (ZYPREXA ) 20 MG tablet Take 2 tablets (40 mg total) by mouth at bedtime. 60 tablet 3   OLANZapine  (ZYPREXA ) 20 MG tablet Take 2 tablets (40 mg total) by mouth at bedtime. 180 tablet 0   No current facility-administered medications on file prior to visit.   No Known Allergies  Past Medical History:  Diagnosis Date   Bipolar 1 disorder (HCC) 05/13/2011   Cigarette smoker 05/26/2011   Have asked him to continue consideration of quitting smoking completely.    Depression    HIV (human immunodeficiency virus infection) (HCC) 05/13/2011   HIV infection (HCC)    Rash, skin 05/13/2011   Bilateral arm     Schizo affective schizophrenia (HCC) 05/13/2011   Past  Surgical History:  Procedure Laterality Date   Broke Left arm  1991   Social History   Socioeconomic History   Marital status: Married    Spouse name: Not on file   Number of children: 0   Years of education: 16   Highest education level: Not on file  Occupational History   Occupation: Retired   Tobacco Use   Smoking status: Every Day    Current packs/day: 1.00    Average packs/day: 1 pack/day for 32.0 years (32.0 ttl pk-yrs)    Types: Cigarettes   Smokeless tobacco: Never  Vaping Use   Vaping status: Never Used  Substance and Sexual Activity   Alcohol use: Yes    Alcohol/week: 1.0 standard drink of alcohol    Types: 1 Standard drinks or equivalent per week    Comment: once every 3 months   Drug use: No    Frequency: 7.0 times per week    Types: Marijuana   Sexual activity: Not Currently    Comment: declined condoms  Other Topics Concern   Not on file  Social History Narrative   Fun: Exercise and outdoor activity.    Denies religious beliefs effecting health care.    Social Drivers of Health   Financial Resource Strain: Medium Risk (07/28/2023)   Overall Financial Resource Strain (CARDIA)    Difficulty of Paying Living Expenses: Somewhat hard  Food Insecurity: Food Insecurity Present (07/28/2023)   Hunger Vital Sign    Worried About Running Out of Food in the Last Year: Often true    Ran Out of Food in the Last Year: Often true  Transportation Needs: No Transportation Needs (07/28/2023)   PRAPARE - Administrator, Civil Service (Medical): No    Lack of Transportation (Non-Medical): No  Physical Activity: Sufficiently Active (07/28/2023)   Exercise Vital Sign    Days of Exercise per Week: 7 days    Minutes of Exercise per Session: 30 min  Stress: Stress Concern Present (07/28/2023)   [place]  [employer] - Occupational Stress Questionnaire    Feeling of Stress : To some extent  Social Connections: Socially Isolated (07/28/2023)    Social Connection and Isolation Panel [NHANES]    Frequency of Communication with Friends and Family: Never    Frequency of Social Gatherings with Friends and Family: Never    Attends Religious Services: Never    Database administrator or Organizations: No    Attends Engineer, structural: Never    Marital Status: Never married  Catering manager Violence: Not on file   Family History  Problem Relation Age of Onset   Diabetes Mother    Hypertension Mother    Hyperlipidemia Mother    Stroke Mother    Diabetes Father    Hypertension Father    Hyperlipidemia Father    Diabetes Sister    Hypertension Sister    Hyperlipidemia Sister    Stroke Sister    Diabetes Brother    Hypertension Brother    Hyperlipidemia Brother    Cancer Maternal Grandmother    Vitals BP 121/75   Pulse 73   Temp 98.3 F (36.8 C) (Oral)   Wt 231 lb (104.8 kg)   SpO2 97%   BMI 30.48 kg/m   Examination  Gen: no acute distress HEENT: [place]/AT, no scleral icterus, no pale conjunctivae, hearing normal, oral mucosa moist Neck: Supple Cardio: Regular rate and rhythm, S1S2 Resp: Pulmonary effort normal in room air, Normal breath sounds  GI: nondistended, non tender and soft  GU: Musc: Extremities: No pedal edema Skin: No rashes Neuro: grossly non focal , awake, alert and oriented * 3  Psych: Calm, cooperative  Lab Results HIV 1 RNA Quant (Copies/mL)  Date Value  12/29/2022 <20 (H)  04/14/2022 <20 (H)  03/31/2021 Not Detected   CD4 T Cell Abs (/uL)  Date Value  12/29/2022 465  04/14/2022 295 (L)  03/31/2021 410   No results found for: "HIV1GENOSEQ" Lab Results  Component Value Date   WBC 8.2 07/28/2023   HGB 16.1 07/28/2023   HCT 47.9 07/28/2023   MCV 90.4 07/28/2023   PLT 286.0 07/28/2023    Lab Results  Component Value Date   CREATININE 1.00 07/28/2023   BUN 12 07/28/2023   NA 136 07/28/2023   K 4.5 07/28/2023   CL 102 07/28/2023   CO2 27 07/28/2023   Lab Results   Component Value Date   ALT 7 07/28/2023   AST 11 07/28/2023   ALKPHOS 85 07/28/2023   BILITOT 0.3 07/28/2023    Lab Results  Component Value Date   CHOL 220 (H) 07/28/2023   TRIG 80.0 07/28/2023   HDL 44.40 07/28/2023   LDLCALC 159 (H) 07/28/2023   No results found for: "HAV" Lab Results  Component Value Date   HEPBSAG NEGATIVE 05/12/2011   HEPBSAB NEG 05/12/2011   Lab Results  Component Value Date   HCVAB NEGATIVE 05/12/2011   Lab Results  Component Value Date   CHLAMYDIAWP Negative 02/05/2015   N Negative 02/05/2015   No results found for: "GCPROBEAPT" No results found for: "QUANTGOLD"  Health Maintenance: Immunization History  Administered Date(s) Administered   Hepatitis A 08/04/2009   Hepatitis B 08/04/2009   Influenza Split 08/08/2012   Influenza Whole 05/26/2011   Influenza, Seasonal, Injecte, Preservative Fre 07/28/2023   Influenza,inj,Quad PF,6+ Mos 07/09/2014   PFIZER Comirnaty(Gray Top)Covid-19 Tri-Sucrose Vaccine 04/14/2021   PFIZER(Purple Top)SARS-COV-2 Vaccination 09/21/2020   PPD Test 10/06/2009, 05/12/2011   Pneumococcal Polysaccharide-23 12/30/2008  Tdap 11/15/2012, 07/28/2023   Assessment/Plan: # HIV - continue descovy  and tivicay , refills sent  - labs today - fu in 6 months   # Hyperlipidemia - on rosuvastatin    # Bipolar d/o, Depression and Schizophrenia - Mental health stable - On olanzapine    # Smoking  - counseled    # STD Screening  - declined   # Immunization  - to be reviewed in subsequent visits  # Health maintenance - Referred to dental clinic - will check for Hepatitis A and B serology to see if needs to be vaccinated  - Quantiferon  - Lipid panel  Patient's labs were reviewed as well as his previous records. Patients questions were addressed and answered. Safe sex counseling done.  I have personally spent 42 minutes involved in face-to-face and non-face-to-face activities for this patient on the day of the  visit. Professional time spent includes the following activities: Preparing to see the patient (review of tests), Obtaining and/or reviewing separately obtained history (admission/discharge record), Performing a medically appropriate examination and/or evaluation , Ordering medications/tests/procedures, referring and communicating with other health care professionals, Documenting clinical information in the EMR, Independently interpreting results (not separately reported), Communicating results to the patient/family/caregiver, Counseling and educating the patient/family/caregiver and Care coordination (not separately reported).   Of note, portions of this note may have been created with voice recognition software. While this note has been edited for accuracy, occasional wrong-word or 'sound-a-like' substitutions may have occurred due to the inherent limitations of voice recognition software.   Electronically signed by:  Terre Ferri, MD Infectious Disease Physician [facility] [address] [place], [place] [address] Phone: [phone]  Fax: [phone]

## 2024-02-16 DIAGNOSIS — Z7185 Encounter for immunization safety counseling: Secondary | ICD-10-CM | POA: Insufficient documentation

## 2024-02-16 DIAGNOSIS — Z113 Encounter for screening for infections with a predominantly sexual mode of transmission: Secondary | ICD-10-CM | POA: Insufficient documentation

## 2024-02-16 DIAGNOSIS — F17200 Nicotine dependence, unspecified, uncomplicated: Secondary | ICD-10-CM | POA: Insufficient documentation

## 2024-02-18 LAB — T-HELPER CELLS (CD4) COUNT (NOT AT ARMC)
Absolute CD4: 591 {cells}/uL (ref 490–1740)
CD4 T Helper %: 17 % — ABNORMAL LOW (ref 30–61)
Total lymphocyte count: 3391 {cells}/uL (ref 850–3900)

## 2024-02-18 LAB — CBC
HCT: 47.3 % (ref 38.5–50.0)
Hemoglobin: 16.3 g/dL (ref 13.2–17.1)
MCH: 30 pg (ref 27.0–33.0)
MCHC: 34.5 g/dL (ref 32.0–36.0)
MCV: 87.1 fL (ref 80.0–100.0)
MPV: 9.8 fL (ref 7.5–12.5)
Platelets: 222 10*3/uL (ref 140–400)
RBC: 5.43 10*6/uL (ref 4.20–5.80)
RDW: 17.6 % — ABNORMAL HIGH (ref 11.0–15.0)
WBC: 6.1 10*3/uL (ref 3.8–10.8)

## 2024-02-18 LAB — COMPREHENSIVE METABOLIC PANEL WITH GFR
AG Ratio: 1.4 (calc) (ref 1.0–2.5)
ALT: 7 U/L — ABNORMAL LOW (ref 9–46)
AST: 10 U/L (ref 10–35)
Albumin: 4.3 g/dL (ref 3.6–5.1)
Alkaline phosphatase (APISO): 64 U/L (ref 35–144)
BUN: 7 mg/dL (ref 7–25)
CO2: 26 mmol/L (ref 20–32)
Calcium: 9.3 mg/dL (ref 8.6–10.3)
Chloride: 107 mmol/L (ref 98–110)
Creat: 0.84 mg/dL (ref 0.70–1.35)
Globulin: 3 g/dL (ref 1.9–3.7)
Glucose, Bld: 77 mg/dL (ref 65–99)
Potassium: 4.3 mmol/L (ref 3.5–5.3)
Sodium: 138 mmol/L (ref 135–146)
Total Bilirubin: 0.4 mg/dL (ref 0.2–1.2)
Total Protein: 7.3 g/dL (ref 6.1–8.1)
eGFR: 99 mL/min/{1.73_m2} (ref >=60)

## 2024-02-18 LAB — HEPATITIS A ANTIBODY, TOTAL: Hepatitis A AB,Total: NONREACTIVE

## 2024-02-18 LAB — QUANTIFERON-TB GOLD PLUS
Mitogen-NIL: 7.52 [IU]/mL
NIL: 0.01 [IU]/mL
QuantiFERON-TB Gold Plus: NEGATIVE
TB1-NIL: 0.01 [IU]/mL
TB2-NIL: 0.01 [IU]/mL

## 2024-02-18 LAB — LIPID PANEL
Cholesterol: 138 mg/dL (ref <200)
HDL: 49 mg/dL (ref >=40)
LDL Cholesterol (Calc): 72 mg/dL
Non-HDL Cholesterol (Calc): 89 mg/dL (ref <130)
Total CHOL/HDL Ratio: 2.8 (calc) (ref <5.0)
Triglycerides: 85 mg/dL (ref <150)

## 2024-02-18 LAB — HEPATITIS B SURFACE ANTIBODY,QUALITATIVE: Hep B S Ab: NONREACTIVE

## 2024-02-20 NOTE — Progress Notes (Signed)
 The 10-year ASCVD risk score (Arnett DK, et al., 2019) is: 12.2%   Values used to calculate the score:     Age: 63 years     Sex: Male     Is Non-Hispanic African American: Yes     Diabetic: No     Tobacco smoker: Yes     Systolic Blood Pressure: 121 mmHg     Is BP treated: No     HDL Cholesterol: 49 mg/dL     Total Cholesterol: 138 mg/dL  Arlon Bergamo, BSN, RN

## 2024-02-26 ENCOUNTER — Other Ambulatory Visit: Payer: Self-pay | Admitting: Internal Medicine

## 2024-02-26 ENCOUNTER — Other Ambulatory Visit: Payer: Self-pay

## 2024-02-26 ENCOUNTER — Other Ambulatory Visit (HOSPITAL_COMMUNITY): Payer: Self-pay

## 2024-02-26 DIAGNOSIS — F29 Unspecified psychosis not due to a substance or known physiological condition: Secondary | ICD-10-CM

## 2024-02-26 MED ORDER — OLANZAPINE 20 MG PO TABS
40.0000 mg | ORAL_TABLET | Freq: Every day | ORAL | 0 refills | Status: DC
  Filled 2024-02-26: qty 40, 20d supply, fill #0

## 2024-02-26 NOTE — Telephone Encounter (Signed)
 Medication not ordered by this office since 2014. Patient will need to contact PCP or psych provider.   Wylee Ogden, BSN, RN

## 2024-02-27 ENCOUNTER — Other Ambulatory Visit: Payer: Self-pay

## 2024-02-27 ENCOUNTER — Other Ambulatory Visit (HOSPITAL_COMMUNITY): Payer: Self-pay

## 2024-02-28 ENCOUNTER — Other Ambulatory Visit (HOSPITAL_COMMUNITY): Payer: Self-pay

## 2024-03-11 ENCOUNTER — Other Ambulatory Visit: Payer: Self-pay

## 2024-03-13 ENCOUNTER — Other Ambulatory Visit (HOSPITAL_COMMUNITY): Payer: Self-pay

## 2024-03-13 ENCOUNTER — Other Ambulatory Visit: Payer: Self-pay

## 2024-03-13 NOTE — Progress Notes (Signed)
 Specialty Pharmacy Ongoing Clinical Assessment Note  Randall Garrett is a 63 y.o. male who is being followed by the specialty pharmacy service for RxSp HIV   Patient's specialty medication(s) reviewed today: Dolutegravir  Sodium (Tivicay ); Emtricitabine -Tenofovir  AF (Descovy )   Missed doses in the last 4 weeks: 0   Patient/Caregiver did not have any additional questions or concerns.   Therapeutic benefit summary: Patient is achieving benefit   Adverse events/side effects summary: No adverse events/side effects   Patient's therapy is appropriate to: Continue    Goals Addressed             This Visit's Progress    Achieve Undetectable HIV Viral Load < 20   On track    Patient is on track. Patient will maintain adherence. Viral load <20 copies/mL as of 12/29/22      Comply with lab assessments   On track    Patient is on track. Patient will adhere to provider and/or lab appointments.      Improve or maintain quality of life   On track    Patient is on track. Patient will be monitored by provider to determine if a change in treatment plan is warranted.         Follow up: 6 months  [facility]

## 2024-03-15 ENCOUNTER — Other Ambulatory Visit (HOSPITAL_COMMUNITY): Payer: Self-pay

## 2024-03-20 ENCOUNTER — Other Ambulatory Visit (HOSPITAL_COMMUNITY): Payer: Self-pay

## 2024-03-20 ENCOUNTER — Other Ambulatory Visit: Payer: Self-pay

## 2024-03-20 MED ORDER — OLANZAPINE 20 MG PO TABS
40.0000 mg | ORAL_TABLET | Freq: Every day | ORAL | 0 refills | Status: DC
  Filled 2024-03-20: qty 180, 90d supply, fill #0

## 2024-04-01 ENCOUNTER — Other Ambulatory Visit (HOSPITAL_COMMUNITY): Payer: Self-pay

## 2024-04-01 ENCOUNTER — Other Ambulatory Visit: Payer: Self-pay

## 2024-04-01 ENCOUNTER — Other Ambulatory Visit: Payer: Self-pay | Admitting: Pharmacy Technician

## 2024-04-01 NOTE — Progress Notes (Signed)
 Specialty Pharmacy Refill Coordination Note  Randall Garrett is a 63 y.o. male contacted today regarding refills of specialty medication(s) Dolutegravir  Sodium (Tivicay ); Emtricitabine -Tenofovir  AF (Descovy )   Patient requested Delivery   Delivery date: 04/04/24   Verified address: Patient address [address]  [place]   Medication will be filled on 04/03/24.

## 2024-04-02 ENCOUNTER — Other Ambulatory Visit: Payer: Self-pay

## 2024-04-25 ENCOUNTER — Other Ambulatory Visit: Payer: Self-pay

## 2024-04-27 ENCOUNTER — Other Ambulatory Visit (HOSPITAL_COMMUNITY): Payer: Self-pay

## 2024-04-29 ENCOUNTER — Other Ambulatory Visit (HOSPITAL_COMMUNITY): Payer: Self-pay

## 2024-04-30 ENCOUNTER — Other Ambulatory Visit: Payer: Self-pay

## 2024-04-30 NOTE — Progress Notes (Signed)
 Specialty Pharmacy Refill Coordination Note  Randall Garrett is a 63 y.o. male contacted today regarding refills of specialty medication(s) Dolutegravir  Sodium (Tivicay ); Emtricitabine -Tenofovir  AF (Descovy )   Patient requested Delivery   Delivery date: 05/02/24   Verified address: Patient address [address]  [place]   Medication will be filled on 07.23.25.

## 2024-05-24 ENCOUNTER — Other Ambulatory Visit: Payer: Self-pay

## 2024-05-27 ENCOUNTER — Other Ambulatory Visit (HOSPITAL_COMMUNITY): Payer: Self-pay

## 2024-05-29 ENCOUNTER — Other Ambulatory Visit: Payer: Self-pay

## 2024-05-31 ENCOUNTER — Other Ambulatory Visit: Payer: Self-pay

## 2024-05-31 ENCOUNTER — Other Ambulatory Visit (HOSPITAL_BASED_OUTPATIENT_CLINIC_OR_DEPARTMENT_OTHER): Payer: Self-pay

## 2024-05-31 MED ORDER — OLANZAPINE 20 MG PO TABS
40.0000 mg | ORAL_TABLET | Freq: Every evening | ORAL | 0 refills | Status: DC
  Filled 2024-06-12 – 2024-06-18 (×2): qty 60, 30d supply, fill #0

## 2024-05-31 NOTE — Progress Notes (Signed)
 Specialty Pharmacy Refill Coordination Note  Randall Garrett is a 63 y.o. male contacted today regarding refills of specialty medication(s) Dolutegravir  Sodium (Tivicay ); Emtricitabine -Tenofovir  AF (Descovy )   Patient requested Delivery   Delivery date: 06/04/24   Verified address: [address]  [place]   Medication will be filled on 06/03/24.

## 2024-06-12 ENCOUNTER — Other Ambulatory Visit: Payer: Self-pay

## 2024-06-12 ENCOUNTER — Other Ambulatory Visit (HOSPITAL_COMMUNITY): Payer: Self-pay

## 2024-06-13 ENCOUNTER — Other Ambulatory Visit: Payer: Self-pay

## 2024-06-13 ENCOUNTER — Other Ambulatory Visit (HOSPITAL_COMMUNITY): Payer: Self-pay

## 2024-06-14 ENCOUNTER — Other Ambulatory Visit (HOSPITAL_COMMUNITY): Payer: Self-pay

## 2024-06-14 ENCOUNTER — Other Ambulatory Visit: Payer: Self-pay

## 2024-06-18 ENCOUNTER — Other Ambulatory Visit: Payer: Self-pay

## 2024-06-18 ENCOUNTER — Other Ambulatory Visit (HOSPITAL_COMMUNITY): Payer: Self-pay

## 2024-06-19 ENCOUNTER — Other Ambulatory Visit: Payer: Self-pay

## 2024-06-26 ENCOUNTER — Other Ambulatory Visit (HOSPITAL_COMMUNITY): Payer: Self-pay

## 2024-06-27 ENCOUNTER — Other Ambulatory Visit: Payer: Self-pay

## 2024-06-27 ENCOUNTER — Other Ambulatory Visit (HOSPITAL_COMMUNITY): Payer: Self-pay

## 2024-06-27 MED ORDER — OLANZAPINE 20 MG PO TABS
40.0000 mg | ORAL_TABLET | Freq: Every evening | ORAL | 0 refills | Status: DC
  Filled 2024-07-18: qty 180, 90d supply, fill #0

## 2024-06-28 ENCOUNTER — Other Ambulatory Visit: Payer: Self-pay

## 2024-07-01 ENCOUNTER — Other Ambulatory Visit: Payer: Self-pay

## 2024-07-18 ENCOUNTER — Other Ambulatory Visit: Payer: Self-pay

## 2024-07-18 ENCOUNTER — Other Ambulatory Visit: Payer: Self-pay | Admitting: Pharmacy Technician

## 2024-07-18 ENCOUNTER — Other Ambulatory Visit (HOSPITAL_COMMUNITY): Payer: Self-pay

## 2024-07-18 NOTE — Progress Notes (Signed)
 Specialty Pharmacy Refill Coordination Note  Randall Garrett is a 63 y.o. male contacted today regarding refills of specialty medication(s) Dolutegravir  Sodium (Tivicay ); Emtricitabine -Tenofovir  AF (Descovy )   Patient requested Delivery   Delivery date: 07/19/24   Verified address: [address]  [place]   Medication will be filled on 07/18/24.

## 2024-07-19 ENCOUNTER — Other Ambulatory Visit: Payer: Self-pay

## 2024-07-19 ENCOUNTER — Other Ambulatory Visit (HOSPITAL_COMMUNITY): Payer: Self-pay

## 2024-07-22 ENCOUNTER — Other Ambulatory Visit: Payer: Self-pay

## 2024-07-22 ENCOUNTER — Other Ambulatory Visit (HOSPITAL_COMMUNITY): Payer: Self-pay

## 2024-08-09 ENCOUNTER — Other Ambulatory Visit: Payer: Self-pay

## 2024-08-12 ENCOUNTER — Other Ambulatory Visit (HOSPITAL_COMMUNITY): Payer: Self-pay

## 2024-08-14 ENCOUNTER — Other Ambulatory Visit: Payer: Self-pay

## 2024-08-14 NOTE — Progress Notes (Signed)
 Specialty Pharmacy Refill Coordination Note  Randall Garrett is a 63 y.o. male contacted today regarding refills of specialty medication(s) Dolutegravir  Sodium (Tivicay ); Emtricitabine -Tenofovir  AF (Descovy )   Patient requested Delivery   Delivery date: 08/15/24   Verified address: [address]   [place]   Medication will be filled on: 08/14/24

## 2024-08-26 ENCOUNTER — Other Ambulatory Visit: Payer: Self-pay

## 2024-08-28 ENCOUNTER — Telehealth: Payer: Self-pay

## 2024-08-28 ENCOUNTER — Ambulatory Visit: Admitting: Infectious Diseases

## 2024-08-28 ENCOUNTER — Other Ambulatory Visit: Payer: Self-pay

## 2024-08-28 ENCOUNTER — Other Ambulatory Visit (HOSPITAL_COMMUNITY): Payer: Self-pay

## 2024-08-28 ENCOUNTER — Encounter: Payer: Self-pay | Admitting: Infectious Diseases

## 2024-08-28 VITALS — BP 109/72 | HR 87 | Temp 97.5°F | Ht 73.0 in | Wt 237.0 lb

## 2024-08-28 DIAGNOSIS — Z79899 Other long term (current) drug therapy: Secondary | ICD-10-CM | POA: Diagnosis not present

## 2024-08-28 DIAGNOSIS — Z7185 Encounter for immunization safety counseling: Secondary | ICD-10-CM

## 2024-08-28 DIAGNOSIS — Z0000 Encounter for general adult medical examination without abnormal findings: Secondary | ICD-10-CM

## 2024-08-28 DIAGNOSIS — E785 Hyperlipidemia, unspecified: Secondary | ICD-10-CM

## 2024-08-28 DIAGNOSIS — F319 Bipolar disorder, unspecified: Secondary | ICD-10-CM

## 2024-08-28 DIAGNOSIS — B20 Human immunodeficiency virus [HIV] disease: Secondary | ICD-10-CM | POA: Diagnosis not present

## 2024-08-28 DIAGNOSIS — Z113 Encounter for screening for infections with a predominantly sexual mode of transmission: Secondary | ICD-10-CM

## 2024-08-28 DIAGNOSIS — F17210 Nicotine dependence, cigarettes, uncomplicated: Secondary | ICD-10-CM

## 2024-08-28 DIAGNOSIS — F32A Depression, unspecified: Secondary | ICD-10-CM | POA: Insufficient documentation

## 2024-08-28 MED ORDER — BICTEGRAVIR-EMTRICITAB-TENOFOV 50-200-25 MG PO TABS
1.0000 | ORAL_TABLET | Freq: Every day | ORAL | 11 refills | Status: AC
  Filled 2024-08-28 – 2024-08-29 (×2): qty 30, 30d supply, fill #0
  Filled 2024-09-23 – 2024-10-01 (×3): qty 30, 30d supply, fill #1
  Filled 2024-10-25: qty 30, 30d supply, fill #2

## 2024-08-28 NOTE — Progress Notes (Signed)
 [address], [place], [place], [address]                                                                  Phn. [phone]; Fax: [phone]                                                                             Date: 08/28/24  Reason for Visit: Routine HIV care.  HPI: Randall Garrett is a 63 y.o.old male with a history of Bipolar d/o, Depression, Schizophrenia, Hyperlipidemia, Emphysema, smoker HIV ( Previously on Truvada and Isentress , switched to Descovy  and Tivicay  in 10/27/2015  for simplification) who is here for fu. Patient previously followed by Dr Efrain.   Reports compliance with Descovy  and Tivicay  without any missed doses or concerns.  He reports not being sexually active for twenty years and prefers male partners only.  He smokes ten cigarettes daily, reduced from a previous pack a day, and is attempting to quit. He lives alone and has recently lost six pounds, now weighing 231 pounds. He follows with his PCP. Wants to be seen by dentist, Refused vaccines and STD screen today. No complaints otherwise.  11/19 Compliant with descovy  and tivicay . Discussed about switching one daily pill as biktarvy and he is agreeable. He has cut down smoking to 10 a day. Willing to get PCV 20 vaccine. He would like to be referred to Mental Health Therapist. Discussed to request refill for other meds not related to HIV. No other concerns.   ROS: As stated in above HPI; all other systems were reviewed and are otherwise negative unless noted below  No reported fever / chills, night sweats, unintentional weight loss, acute visual change, odynophagia, chest pain/pressure, new or worsened SOB or WOB, nausea, vomiting, diarrhea, dysuria, GU discharge, syncope, seizures, red/hot swollen joints,  hallucinations / delusions, rashes, new allergies, unusual / excessive bleeding, swollen lymph nodes, or new hospitalizations/ED visits/Urgent Care visits since the pt was last seen.  PMH/ PSH/ FamHx / Social Hx , medications and allergies reviewed and updated as appropriate; please see corresponding tab in EHR / prior notes                                        Current Outpatient Medications on File Prior to Visit  Medication Sig Dispense Refill   clindamycin  (CLEOCIN -T) 1 % lotion Apply topically daily. Apply to scabs daily as needed 60 mL 2   clobetasol  (TEMOVATE ) 0.05 % external solution Apply 1 Application topically 2 (two) times daily. 50  mL 2   dolutegravir  (TIVICAY ) 50 MG tablet Take 1 tablet (50 mg total) by mouth daily. 30 tablet 11   emtricitabine -tenofovir  AF (DESCOVY ) 200-25 MG tablet Take 1 tablet by mouth daily. 30 tablet 11   folic acid  (V-R FOLIC ACID ) 400 MCG tablet Take 1 tablet (400 mcg total) by mouth daily. 90 tablet 3   hydrocortisone  2.5 % cream Apply topically 2 (two) times daily. 30 g 1   hydrocortisone  2.5 % cream Apply 1 Application topically 2 (two) times daily. 30 g 1   nicotine  (NICODERM CQ ) 14 mg/24hr patch Place 1 patch (14 mg total) onto the skin daily. Use after 21 mg patches. (Patient not taking: Reported on 02/15/2024) 28 patch 0   nicotine  (NICODERM CQ ) 21 mg/24hr patch Place 1 patch (21 mg total) onto the skin daily. Then taper down to 14 mg patches. (Patient not taking: Reported on 02/15/2024) 28 patch 1   OLANZapine  (ZYPREXA ) 20 MG tablet Take 2 tablets (40 mg total) by mouth at bedtime. 60 tablet 3   OLANZapine  (ZYPREXA ) 20 MG tablet Take 2 tablets (40 mg total) by mouth at bedtime. 180 tablet 0   OLANZapine  (ZYPREXA ) 20 MG tablet Take 2 tablets (40 mg total) by mouth at bedtime. 180 tablet 0   rosuvastatin  (CRESTOR ) 10 MG tablet Take 1 tablet (10 mg total) by mouth daily for cholesterol. 90 tablet 3   triamcinolone  cream (KENALOG ) 0.1 % Apply 1 Application  topically 2 (two) times daily. Not for face. 30 g 1   No current facility-administered medications on file prior to visit.   No Known Allergies  Past Medical History:  Diagnosis Date   Bipolar 1 disorder (HCC) 05/13/2011   Cigarette smoker 05/26/2011   Have asked him to continue consideration of quitting smoking completely.    Depression    HIV (human immunodeficiency virus infection) (HCC) 05/13/2011   HIV infection (HCC)    Rash, skin 05/13/2011   Bilateral arm     Schizo affective schizophrenia (HCC) 05/13/2011   Past Surgical History:  Procedure Laterality Date   Broke Left arm  1991   Social History   Socioeconomic History   Marital status: Married    Spouse name: Not on file   Number of children: 0   Years of education: 16   Highest education level: Not on file  Occupational History   Occupation: Retired   Tobacco Use   Smoking status: Every Day    Current packs/day: 1.00    Average packs/day: 1 pack/day for 32.0 years (32.0 ttl pk-yrs)    Types: Cigarettes   Smokeless tobacco: Never  Vaping Use   Vaping status: Never Used  Substance and Sexual Activity   Alcohol use: Yes    Alcohol/week: 1.0 standard drink of alcohol    Types: 1 Standard drinks or equivalent per week    Comment: once every 3 months   Drug use: No    Frequency: 7.0 times per week    Types: Marijuana   Sexual activity: Not Currently    Comment: declined condoms  Other Topics Concern   Not on file  Social History Narrative   Fun: Exercise and outdoor activity.    Denies religious beliefs effecting health care.    Social Drivers of Health   Financial Resource Strain: Medium Risk (07/28/2023)   Overall Financial Resource Strain (CARDIA)    Difficulty of Paying Living Expenses: Somewhat hard  Food Insecurity: Food Insecurity Present (07/28/2023)   Hunger Vital Sign  Worried About Programme Researcher, Broadcasting/film/video in the Last Year: Often true    [employer] of Food in the Last Year: Often true  Transportation  Needs: No Transportation Needs (07/28/2023)   PRAPARE - Administrator, Civil Service (Medical): No    Lack of Transportation (Non-Medical): No  Physical Activity: Sufficiently Active (07/28/2023)   Exercise Vital Sign    Days of Exercise per Week: 7 days    Minutes of Exercise per Session: 30 min  Stress: Stress Concern Present (07/28/2023)   [employer] of Occupational Health - Occupational Stress Questionnaire    Feeling of Stress : To some extent  Social Connections: Socially Isolated (07/28/2023)   Social Connection and Isolation Panel    Frequency of Communication with Friends and Family: Never    Frequency of Social Gatherings with Friends and Family: Never    Attends Religious Services: Never    Database Administrator or Organizations: No    Attends Engineer, Structural: Never    Marital Status: Never married  Catering Manager Violence: Not on file   Family History  Problem Relation Age of Onset   Diabetes Mother    Hypertension Mother    Hyperlipidemia Mother    Stroke Mother    Diabetes Father    Hypertension Father    Hyperlipidemia Father    Diabetes Sister    Hypertension Sister    Hyperlipidemia Sister    Stroke Sister    Diabetes Brother    Hypertension Brother    Hyperlipidemia Brother    Cancer Maternal Grandmother    Vitals BP 109/72   Pulse 87   Temp (!) 97.5 F (36.4 C) (Temporal)   Ht 6' 1 (1.854 m)   Wt 237 lb (107.5 kg)   SpO2 95%   BMI 31.27 kg/m    Examination  Gen: no acute distress HEENT: [place]/AT, no scleral icterus, no pale conjunctivae, hearing normal, oral mucosa moist Neck: Supple Cardio: Regular rate  Resp: Pulmonary effort normal in room air GI: nondistended GU: Musc: Extremities: No pedal edema Skin: No rashes Neuro: grossly non focal , awake, alert and oriented * 3  Psych: Calm, cooperative  Lab Results HIV 1 RNA Quant (Copies/mL)  Date Value  12/29/2022 <20 (H)  04/14/2022 <20 (H)   03/31/2021 Not Detected   CD4 T Cell Abs (/uL)  Date Value  12/29/2022 465  04/14/2022 295 (L)  03/31/2021 410   No results found for: HIV1GENOSEQ Lab Results  Component Value Date   WBC 6.1 02/15/2024   HGB 16.3 02/15/2024   HCT 47.3 02/15/2024   MCV 87.1 02/15/2024   PLT 222 02/15/2024    Lab Results  Component Value Date   CREATININE 0.84 02/15/2024   BUN 7 02/15/2024   NA 138 02/15/2024   K 4.3 02/15/2024   CL 107 02/15/2024   CO2 26 02/15/2024   Lab Results  Component Value Date   ALT 7 (L) 02/15/2024   AST 10 02/15/2024   ALKPHOS 85 07/28/2023   BILITOT 0.4 02/15/2024    Lab Results  Component Value Date   CHOL 138 02/15/2024   TRIG 85 02/15/2024   HDL 49 02/15/2024   LDLCALC 72 02/15/2024   Lab Results  Component Value Date   HAV NON-REACTIVE 02/15/2024   Lab Results  Component Value Date   HEPBSAG NEGATIVE 05/12/2011   HEPBSAB NON-REACTIVE 02/15/2024   Lab Results  Component Value Date   HCVAB NEGATIVE 05/12/2011  Lab Results  Component Value Date   CHLAMYDIAWP Negative 02/05/2015   N Negative 02/05/2015   No results found for: GCPROBEAPT No results found for: QUANTGOLD  Health Maintenance: Immunization History  Administered Date(s) Administered   Hepatitis A 08/04/2009   Hepatitis B 08/04/2009   Influenza Split 08/08/2012   Influenza Whole 05/26/2011   Influenza, Seasonal, Injecte, Preservative Fre 07/28/2023   Influenza,inj,Quad PF,6+ Mos 07/09/2014   PFIZER Comirnaty(Gray Top)Covid-19 Tri-Sucrose Vaccine 04/14/2021   PFIZER(Purple Top)SARS-COV-2 Vaccination 09/21/2020   PPD Test 10/06/2009, 05/12/2011   Pneumococcal Polysaccharide-23 12/30/2008   Tdap 11/15/2012, 07/28/2023   Assessment/Plan: # HIV - ART Adherence assessed, side effects reviewed/discussed and DDIs reviewed  - Will change descovy /tivicay  to biktarvy  1 tab po daily  - labs today - fu in 6 weeks for rechecking VL  # Hyperlipidemia - on rosuvastatin     # Bipolar d/o, Depression and Schizophrenia - Mental health stable - On olanzapine   - Referred to Psychiatry per his request   # Smoking  - encouraged further cutting down on cigarettes    # STD Screening  - Urine/oral/anal GC + RPR  # Immunization  - declined flu, covid, PCV 20  # Health maintenance - Has been referred to dental clinic - Colon ca screening 08/18/2023 cologuard negative, rescreening per PCP  Patient's labs were reviewed as well as his previous records. Patients questions were addressed and answered. Safe sex counseling done.  I spent 30 minutes involved in face-to-face and non-face-to-face activities for this patient on the day of the visit. Professional time spent includes the following activities: Preparing to see the patient (review of tests), Performing a medically appropriate examination and evaluation , Ordering medications/labs/referrals, referring and communicating with other health care professionals, Documenting clinical information in the EMR, Independently interpreting results (not separately reported), Communicating results to the patient, Counseling and educating the patient and Care coordination (not separately reported).   Of note, portions of this note may have been created with voice recognition software. While this note has been edited for accuracy, occasional wrong-word or 'sound-a-like' substitutions may have occurred due to the inherent limitations of voice recognition software.   Electronically signed by:  Annalee Orem, MD Infectious Disease Physician [facility] [address] [place], [place] [address] Phone: [phone]  Fax: [phone]

## 2024-08-28 NOTE — Telephone Encounter (Signed)
 [facility] Patient Advocate Encounter   I was successful in securing patient a $5000 grant from [employer] (PA[employer]) to provide copayment coverage for $75.  This will make the out of pocket cost $0.     I have spoken with the patient.    The billing information is as follows and has been shared with [facility].   RxBin: [id number] PCN: [id number] Member ID: [id number] Group ID: [id number] Dates of Eligibility: 03/01/24 through 08/28/25  Patient knows to call the office with questions or concerns.    Charmaine Sharps, CPhT Specialty Pharmacy Patient [facility] for Infectious Disease Phone: [phone] Fax:  [phone]

## 2024-08-29 ENCOUNTER — Other Ambulatory Visit: Payer: Self-pay

## 2024-08-29 ENCOUNTER — Other Ambulatory Visit (HOSPITAL_COMMUNITY): Payer: Self-pay

## 2024-08-29 LAB — T-HELPER CELLS (CD4) COUNT (NOT AT ARMC)
CD4 % Helper T Cell: 19 % — ABNORMAL LOW (ref 33–65)
CD4 T Cell Abs: 607 /uL (ref 400–1790)

## 2024-08-29 NOTE — Progress Notes (Addendum)
 Specialty Pharmacy Refill Coordination Note  Lathen Seal is a 63 y.o. male contacted today regarding refills of specialty medication(s) Bictegravir-Emtricitab-Tenofov (BIKTARVY )   Patient requested Delivery   Delivery date: 09/02/24   Verified address: [address] [place] [address]   Medication will be filled on: 08/30/24  PLEASE USE PAF COPAY CARD ON FILE

## 2024-08-31 LAB — COMPREHENSIVE METABOLIC PANEL WITH GFR
AG Ratio: 1.6 (calc) (ref 1.0–2.5)
ALT: 9 U/L (ref 9–46)
AST: 10 U/L (ref 10–35)
Albumin: 4.4 g/dL (ref 3.6–5.1)
Alkaline phosphatase (APISO): 64 U/L (ref 35–144)
BUN: 7 mg/dL (ref 7–25)
CO2: 21 mmol/L (ref 20–32)
Calcium: 9.1 mg/dL (ref 8.6–10.3)
Chloride: 107 mmol/L (ref 98–110)
Creat: 0.85 mg/dL (ref 0.70–1.35)
Globulin: 2.8 g/dL (ref 1.9–3.7)
Glucose, Bld: 75 mg/dL (ref 65–99)
Potassium: 4.2 mmol/L (ref 3.5–5.3)
Sodium: 138 mmol/L (ref 135–146)
Total Bilirubin: 0.3 mg/dL (ref 0.2–1.2)
Total Protein: 7.2 g/dL (ref 6.1–8.1)
eGFR: 98 mL/min/1.73m2 (ref >=60)

## 2024-08-31 LAB — HIV RNA, RTPCR W/R GT (RTI, PI,INT)
HIV 1 RNA Quant: NOT DETECTED {copies}/mL
HIV-1 RNA Quant, Log: NOT DETECTED {Log_copies}/mL

## 2024-08-31 LAB — SYPHILIS: RPR W/REFLEX TO RPR TITER AND TREPONEMAL ANTIBODIES, TRADITIONAL SCREENING AND DIAGNOSIS ALGORITHM: RPR Ser Ql: NONREACTIVE

## 2024-09-02 ENCOUNTER — Ambulatory Visit: Payer: Self-pay | Admitting: Infectious Diseases

## 2024-09-23 ENCOUNTER — Other Ambulatory Visit: Payer: Self-pay

## 2024-09-24 ENCOUNTER — Other Ambulatory Visit (HOSPITAL_COMMUNITY): Payer: Self-pay

## 2024-09-25 ENCOUNTER — Other Ambulatory Visit (HOSPITAL_COMMUNITY): Payer: Self-pay

## 2024-09-25 ENCOUNTER — Other Ambulatory Visit: Payer: Self-pay

## 2024-09-27 ENCOUNTER — Other Ambulatory Visit: Payer: Self-pay

## 2024-09-30 ENCOUNTER — Other Ambulatory Visit: Payer: Self-pay

## 2024-10-01 ENCOUNTER — Other Ambulatory Visit: Payer: Self-pay

## 2024-10-01 ENCOUNTER — Other Ambulatory Visit (HOSPITAL_COMMUNITY): Payer: Self-pay

## 2024-10-01 NOTE — Progress Notes (Signed)
 Specialty Pharmacy Refill Coordination Note  Randall Garrett is a 63 y.o. male contacted today regarding refills of specialty medication(s) Bictegravir-Emtricitab-Tenofov (BIKTARVY )   Patient requested Delivery   Delivery date: 10/04/24   Verified address: [address] [place] [address]   Medication will be filled on: 10/02/24

## 2024-10-02 ENCOUNTER — Other Ambulatory Visit: Payer: Self-pay

## 2024-10-07 ENCOUNTER — Other Ambulatory Visit: Payer: Self-pay

## 2024-10-07 ENCOUNTER — Other Ambulatory Visit (HOSPITAL_COMMUNITY): Payer: Self-pay

## 2024-10-07 MED ORDER — OLANZAPINE 20 MG PO TABS
40.0000 mg | ORAL_TABLET | Freq: Every evening | ORAL | 0 refills | Status: AC
  Filled 2024-10-07 – 2024-10-17 (×2): qty 60, 30d supply, fill #0

## 2024-10-08 ENCOUNTER — Ambulatory Visit (INDEPENDENT_AMBULATORY_CARE_PROVIDER_SITE_OTHER): Admitting: Infectious Diseases

## 2024-10-08 ENCOUNTER — Other Ambulatory Visit: Payer: Self-pay

## 2024-10-08 ENCOUNTER — Encounter: Payer: Self-pay | Admitting: Infectious Diseases

## 2024-10-08 VITALS — BP 148/75 | HR 79 | Temp 97.0°F | Ht 72.0 in | Wt 235.0 lb

## 2024-10-08 DIAGNOSIS — B20 Human immunodeficiency virus [HIV] disease: Secondary | ICD-10-CM | POA: Diagnosis not present

## 2024-10-08 DIAGNOSIS — Z7185 Encounter for immunization safety counseling: Secondary | ICD-10-CM | POA: Insufficient documentation

## 2024-10-08 DIAGNOSIS — E785 Hyperlipidemia, unspecified: Secondary | ICD-10-CM

## 2024-10-08 DIAGNOSIS — F3130 Bipolar disorder, current episode depressed, mild or moderate severity, unspecified: Secondary | ICD-10-CM

## 2024-10-08 DIAGNOSIS — Z23 Encounter for immunization: Secondary | ICD-10-CM

## 2024-10-08 DIAGNOSIS — F209 Schizophrenia, unspecified: Secondary | ICD-10-CM

## 2024-10-08 DIAGNOSIS — F17210 Nicotine dependence, cigarettes, uncomplicated: Secondary | ICD-10-CM | POA: Diagnosis not present

## 2024-10-08 DIAGNOSIS — Z79899 Other long term (current) drug therapy: Secondary | ICD-10-CM | POA: Diagnosis not present

## 2024-10-08 NOTE — Progress Notes (Signed)
 "                                                                                                                                                                                                     [address], [place], [place], [address]                                                                  Phn. [phone]; Fax: [phone]                                                                             Date: 10/08/24  Reason for Visit: Routine HIV care.  HPI: Randall Garrett is a 63 y.o.old male with a history of Bipolar d/o, Depression, Schizophrenia, Hyperlipidemia, Emphysema, smoker HIV ( Previously on Truvada and Isentress , switched to Descovy  and Tivicay  in 10/27/2015  for simplification) who is here for fu. Patient previously followed by Dr Efrain.   12/30 Reports taking biktarvy  daily in the morning and he likes it since one pill and smaller. Denies missed doses or concerns. He is willing to get Flu and covid vaccine today. He follows [facility] for his mental health concerns. No other concerns today.   ROS: As stated in above HPI; all other systems were reviewed and are otherwise negative unless noted below  No reported fever / chills, night sweats, unintentional weight loss, acute visual change, odynophagia, chest pain/pressure, new or worsened SOB or WOB, nausea, vomiting, diarrhea, dysuria, GU discharge, syncope, seizures, red/hot swollen joints, hallucinations / delusions, rashes, new allergies, unusual / excessive bleeding, swollen lymph nodes, or new hospitalizations/ED visits/Urgent Care visits since the pt was last seen.  PMH/ PSH/ FamHx / Social Hx , medications and allergies reviewed and updated as appropriate; please see corresponding tab in EHR / prior notes  Outpatient Encounter Medications as of 10/08/2024  Medication Sig   bictegravir-emtricitabine -tenofovir  AF (BIKTARVY ) 50-200-25 MG TABS tablet Take 1 tablet by mouth daily.   clobetasol  (TEMOVATE ) 0.05 %  external solution Apply 1 Application topically 2 (two) times daily.  hydrocortisone  2.5 % cream Apply topically 2 (two) times daily.   OLANZapine  (ZYPREXA ) 20 MG tablet Take 2 tablets (40 mg total) by mouth at bedtime.   rosuvastatin  (CRESTOR ) 10 MG tablet Take 1 tablet (10 mg total) by mouth daily for cholesterol.   triamcinolone  cream (KENALOG ) 0.1 % Apply 1 Application topically 2 (two) times daily. Not for face.   hydrocortisone  2.5 % cream Apply 1 Application topically 2 (two) times daily.   nicotine  (NICODERM CQ ) 14 mg/24hr patch Place 1 patch (14 mg total) onto the skin daily. Use after 21 mg patches. (Patient not taking: Reported on 08/28/2024)   nicotine  (NICODERM CQ ) 21 mg/24hr patch Place 1 patch (21 mg total) onto the skin daily. Then taper down to 14 mg patches. (Patient not taking: Reported on 08/28/2024)   OLANZapine  (ZYPREXA ) 20 MG tablet Take 2 tablets (40 mg total) by mouth at bedtime.   OLANZapine  (ZYPREXA ) 20 MG tablet Take 2 tablets (40 mg total) by mouth at bedtime.   [DISCONTINUED] folic acid  (V-R FOLIC ACID ) 400 MCG tablet Take 1 tablet (400 mcg total) by mouth daily. (Patient not taking: Reported on 10/08/2024)   No facility-administered encounter medications on file as of 10/08/2024.   No Known Allergies  Past Medical History:  Diagnosis Date   Bipolar 1 disorder (HCC) 05/13/2011   Cigarette smoker 05/26/2011   Have asked him to continue consideration of quitting smoking completely.    Depression    HIV (human immunodeficiency virus infection) (HCC) 05/13/2011   HIV infection (HCC)    Rash, skin 05/13/2011   Bilateral arm     Schizo affective schizophrenia (HCC) 05/13/2011   Past Surgical History:  Procedure Laterality Date   Broke Left arm  1991   Social History   Socioeconomic History   Marital status: Married    Spouse name: Not on file   Number of children: 0   Years of education: 16   Highest education level: Not on file  Occupational History    Occupation: Retired   Tobacco Use   Smoking status: Every Day    Current packs/day: 1.00    Average packs/day: 1 pack/day for 32.0 years (32.0 ttl pk-yrs)    Types: Cigarettes   Smokeless tobacco: Never  Vaping Use   Vaping status: Never Used  Substance and Sexual Activity   Alcohol use: Yes    Alcohol/week: 1.0 standard drink of alcohol    Types: 1 Standard drinks or equivalent per week    Comment: once every 3 months   Drug use: No    Frequency: 7.0 times per week    Types: Marijuana   Sexual activity: Not Currently    Comment: declined condoms  Other Topics Concern   Not on file  Social History Narrative   Fun: Exercise and outdoor activity.    Denies religious beliefs effecting health care.    Social Drivers of Health   Tobacco Use: High Risk (08/28/2024)   Patient History    Smoking Tobacco Use: Every Day    Smokeless Tobacco Use: Never    Passive Exposure: Not on file  Financial Resource Strain: Medium Risk (07/28/2023)   Overall Financial Resource Strain (CARDIA)    Difficulty of Paying Living Expenses: Somewhat hard  Food Insecurity: Food Insecurity Present (07/28/2023)   Hunger Vital Sign    Worried About Running Out of Food in the Last Year: Often true    Ran Out of Food in the Last Year: Often true  Transportation Needs:  No Transportation Needs (07/28/2023)   PRAPARE - Administrator, Civil Service (Medical): No    Lack of Transportation (Non-Medical): No  Physical Activity: Sufficiently Active (07/28/2023)   Exercise Vital Sign    Days of Exercise per Week: 7 days    Minutes of Exercise per Session: 30 min  Stress: Stress Concern Present (07/28/2023)   [employer] of Occupational Health - Occupational Stress Questionnaire    Feeling of Stress : To some extent  Social Connections: Socially Isolated (07/28/2023)   Social Connection and Isolation Panel    Frequency of Communication with Friends and Family: Never    Frequency of Social  Gatherings with Friends and Family: Never    Attends Religious Services: Never    Database Administrator or Organizations: No    Attends Engineer, Structural: Never    Marital Status: Never married  Intimate Partner Violence: Not on file  Depression (PHQ2-9): Low Risk (02/15/2024)   Depression (PHQ2-9)    PHQ-2 Score: 2  Alcohol Screen: Low Risk (07/28/2023)   Alcohol Screen    Last Alcohol Screening Score (AUDIT): 2  Housing: Low Risk (07/28/2023)   Housing    Last Housing Risk Score: 0  Utilities: Not At Risk (07/28/2023)   AHC Utilities    Threatened with loss of utilities: No  Health Literacy: Not on file   Family History  Problem Relation Age of Onset   Diabetes Mother    Hypertension Mother    Hyperlipidemia Mother    Stroke Mother    Diabetes Father    Hypertension Father    Hyperlipidemia Father    Diabetes Sister    Hypertension Sister    Hyperlipidemia Sister    Stroke Sister    Diabetes Brother    Hypertension Brother    Hyperlipidemia Brother    Cancer Maternal Grandmother    Vitals BP (!) 148/75   Pulse 79   Temp (!) 97 F (36.1 C) (Temporal)   Ht 6' (1.829 m)   Wt 235 lb (106.6 kg)   SpO2 97%   BMI 31.87 kg/m   Examination  Gen: no acute distress HEENT: [place]/AT, no scleral icterus, no pale conjunctivae, hearing normal, oral mucosa moist Neck: Supple Cardio: Normal rate  Resp: Pulmonary effort normal in room air GI: nondistended GU: Musc: Extremities: No pedal edema Skin: No rashes Neuro: grossly non focal , awake, alert Psych: Calm, cooperative  Lab Results HIV 1 RNA Quant  Date Value  08/28/2024 NOT DETECTED copies/mL  12/29/2022 <20 Copies/mL (H)  04/14/2022 <20 Copies/mL (H)   CD4 T Cell Abs (/uL)  Date Value  08/28/2024 607  12/29/2022 465  04/14/2022 295 (L)   No results found for: HIV1GENOSEQ Lab Results  Component Value Date   WBC 6.1 02/15/2024   HGB 16.3 02/15/2024   HCT 47.3 02/15/2024   MCV 87.1  02/15/2024   PLT 222 02/15/2024    Lab Results  Component Value Date   CREATININE 0.85 08/28/2024   BUN 7 08/28/2024   NA 138 08/28/2024   K 4.2 08/28/2024   CL 107 08/28/2024   CO2 21 08/28/2024   Lab Results  Component Value Date   ALT 9 08/28/2024   AST 10 08/28/2024   ALKPHOS 85 07/28/2023   BILITOT 0.3 08/28/2024    Lab Results  Component Value Date   CHOL 138 02/15/2024   TRIG 85 02/15/2024   HDL 49 02/15/2024   LDLCALC 72 02/15/2024  Lab Results  Component Value Date   HAV NON-REACTIVE 02/15/2024   Lab Results  Component Value Date   HEPBSAG NEGATIVE 05/12/2011   HEPBSAB NON-REACTIVE 02/15/2024   Lab Results  Component Value Date   HCVAB NEGATIVE 05/12/2011   Lab Results  Component Value Date   CHLAMYDIAWP Negative 02/05/2015   N Negative 02/05/2015   No results found for: GCPROBEAPT No results found for: QUANTGOLD  Health Maintenance: Immunization History  Administered Date(s) Administered   Hepatitis A 08/04/2009   Hepatitis B 08/04/2009   Influenza Split 08/08/2012   Influenza Whole 05/26/2011   Influenza, Seasonal, Injecte, Preservative Fre 07/28/2023   Influenza,inj,Quad PF,6+ Mos 07/09/2014   PFIZER Comirnaty(Gray Top)Covid-19 Tri-Sucrose Vaccine 04/14/2021   PFIZER(Purple Top)SARS-COV-2 Vaccination 09/21/2020   PPD Test 10/06/2009, 05/12/2011   Pneumococcal Polysaccharide-23 12/30/2008   Tdap 11/15/2012, 07/28/2023   Assessment/Plan: # HIV - ART Adherence assessed, side effects reviewed/discussed and DDIs reviewed  - HIV RNA today  - Continue Biktarvy  1 tab po daily  - fu in 5-6 months pending labs   # Hyperlipidemia - on rosuvastatin    # Bipolar d/o, Depression and Schizophrenia - Mental health stable - On olanzapine   - He reports following with [facility]   # Smoking  - have counseled previously    # STD Screening  - recently screened and negative  # Immunization  -Flu, covid vaccine today. Will discuss PCV 20, HAV  and HBV next visit  # Health maintenance - Has been referred to dental clinic - Colon ca screening 08/18/2023 cologuard negative, rescreening per PCP  I personally spent a total of 20 in the care of the patient today including preparing to see the patient, getting/reviewing separately obtained history, performing a medically appropriate exam/evaluation, counseling and educating, placing orders, documenting clinical information in the EHR, independently interpreting results, and communicating results.  Electronically signed by:  Annalee Orem, MD Infectious Disease Physician [facility] for Infectious Disease [address] [place], [place] [address] Phone: [phone]  Fax: [phone]  "

## 2024-10-08 NOTE — Patient Instructions (Signed)
 Smoking Cessation: Quitline[place] 1-800-QUIT-NOW 670-772-4699); Espaol: 1-855-Djelo-Ya (1-737-271-8849) [email or web address]

## 2024-10-09 ENCOUNTER — Other Ambulatory Visit (HOSPITAL_COMMUNITY): Payer: Self-pay

## 2024-10-09 ENCOUNTER — Other Ambulatory Visit: Payer: Self-pay

## 2024-10-11 ENCOUNTER — Ambulatory Visit: Payer: Self-pay | Admitting: Infectious Diseases

## 2024-10-11 LAB — HIV RNA, RTPCR W/R GT (RTI, PI,INT)
HIV 1 RNA Quant: 57 {copies}/mL — ABNORMAL HIGH
HIV-1 RNA Quant, Log: 1.76 {Log_copies}/mL — ABNORMAL HIGH

## 2024-10-11 NOTE — Progress Notes (Signed)
Left VM asking patient to return my call.    

## 2024-10-14 ENCOUNTER — Other Ambulatory Visit (HOSPITAL_COMMUNITY): Payer: Self-pay

## 2024-10-14 NOTE — Progress Notes (Signed)
 Second attempt - left vm asking patient to return my call.

## 2024-10-15 ENCOUNTER — Other Ambulatory Visit: Payer: Self-pay

## 2024-10-16 NOTE — Progress Notes (Signed)
 Third attempt to reach patient, no answer. Left HIPAA compliant voicemail requesting callback.   Adilene Areola, BSN, RN

## 2024-10-17 ENCOUNTER — Other Ambulatory Visit: Payer: Self-pay

## 2024-10-17 NOTE — Telephone Encounter (Signed)
 Received voicemail from patient returning call. Called Randall Garrett back, no answer. Voicemails left with previous attempts.   Randall Garrett, BSN, RN

## 2024-10-21 NOTE — Progress Notes (Signed)
 Left vm. Julien Odor, RMA

## 2024-10-24 ENCOUNTER — Other Ambulatory Visit: Payer: Self-pay

## 2024-10-24 MED ORDER — OLANZAPINE 20 MG PO TABS
40.0000 mg | ORAL_TABLET | Freq: Every day | ORAL | 0 refills | Status: AC
  Filled 2024-11-11: qty 180, 90d supply, fill #0

## 2024-10-25 ENCOUNTER — Other Ambulatory Visit: Payer: Self-pay

## 2024-10-29 ENCOUNTER — Other Ambulatory Visit (HOSPITAL_COMMUNITY): Payer: Self-pay

## 2024-10-30 ENCOUNTER — Other Ambulatory Visit: Payer: Self-pay

## 2024-10-30 ENCOUNTER — Other Ambulatory Visit (HOSPITAL_COMMUNITY): Payer: Self-pay

## 2024-10-30 NOTE — Progress Notes (Signed)
 Specialty Pharmacy Refill Coordination Note  Chia Rock is a 64 y.o. male contacted today regarding refills of specialty medication(s) Bictegravir-Emtricitab-Tenofov (BIKTARVY )   Patient requested Delivery   Delivery date: 10/31/24   Verified address: [address] [place] [address]   Medication will be filled on: 10/30/24

## 2024-11-07 NOTE — Telephone Encounter (Signed)
 Patient returned phone call and informed of results. Patient verbalized understanding.  Randall Garrett, CMA

## 2024-11-11 ENCOUNTER — Other Ambulatory Visit (HOSPITAL_COMMUNITY): Payer: Self-pay

## 2025-02-05 ENCOUNTER — Ambulatory Visit: Payer: Self-pay | Admitting: Infectious Diseases
